# Patient Record
Sex: Male | Born: 1985 | Race: Black or African American | Hispanic: No | Marital: Single | Smoking: Former smoker
Health system: Southern US, Community
[De-identification: ages and names within clinical notes are randomized; demographics above are authoritative.]

## PROBLEM LIST (undated history)

## (undated) DIAGNOSIS — T7840XA Allergy, unspecified, initial encounter: Secondary | ICD-10-CM

## (undated) HISTORY — PX: FRACTURE SURGERY: SHX138

## (undated) HISTORY — DX: Allergy, unspecified, initial encounter: T78.40XA

---
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
## 2008-10-31 ENCOUNTER — Emergency Department (HOSPITAL_COMMUNITY): Admission: EM | Admit: 2008-10-31 | Discharge: 2008-10-31 | Payer: Self-pay | Admitting: Emergency Medicine

## 2010-06-14 LAB — URINALYSIS, ROUTINE W REFLEX MICROSCOPIC
Glucose, UA: NEGATIVE mg/dL
Hgb urine dipstick: NEGATIVE
Protein, ur: NEGATIVE mg/dL
Specific Gravity, Urine: 1.025 (ref 1.005–1.030)
pH: 6 (ref 5.0–8.0)

## 2010-07-03 ENCOUNTER — Other Ambulatory Visit: Payer: Self-pay

## 2010-07-03 ENCOUNTER — Other Ambulatory Visit: Payer: Self-pay | Admitting: Sports Medicine

## 2010-07-03 DIAGNOSIS — M25561 Pain in right knee: Secondary | ICD-10-CM

## 2010-07-04 ENCOUNTER — Ambulatory Visit
Admission: RE | Admit: 2010-07-04 | Discharge: 2010-07-04 | Disposition: A | Discharge status: Home / Self Care | Payer: No Typology Code available for payment source | Source: Ambulatory Visit | Attending: Sports Medicine | Admitting: Sports Medicine

## 2010-07-04 DIAGNOSIS — M25561 Pain in right knee: Secondary | ICD-10-CM

## 2010-08-08 ENCOUNTER — Ambulatory Visit (HOSPITAL_BASED_OUTPATIENT_CLINIC_OR_DEPARTMENT_OTHER)
Admission: RE | Admit: 2010-08-08 | Discharge: 2010-08-08 | Disposition: A | Discharge status: Home / Self Care | Payer: No Typology Code available for payment source | Source: Ambulatory Visit | Attending: Orthopedic Surgery | Admitting: Orthopedic Surgery

## 2010-08-08 DIAGNOSIS — S46819A Strain of other muscles, fascia and tendons at shoulder and upper arm level, unspecified arm, initial encounter: Secondary | ICD-10-CM | POA: Insufficient documentation

## 2010-08-08 DIAGNOSIS — X500XXA Overexertion from strenuous movement or load, initial encounter: Secondary | ICD-10-CM | POA: Insufficient documentation

## 2010-08-08 DIAGNOSIS — S43499A Other sprain of unspecified shoulder joint, initial encounter: Secondary | ICD-10-CM | POA: Insufficient documentation

## 2010-08-08 DIAGNOSIS — Z01812 Encounter for preprocedural laboratory examination: Secondary | ICD-10-CM | POA: Insufficient documentation

## 2010-08-08 LAB — POCT HEMOGLOBIN-HEMACUE: Hemoglobin: 15 g/dL (ref 13.0–17.0)

## 2010-08-12 NOTE — Op Note (Signed)
NAME:  Logan Ellis, Logan Ellis NO.:  [id number]  MEDICAL RECORD NO.:  [id number]  LOCATION:                                 FACILITY:  PHYSICIAN:  Eulas Post, MD    DATE OF BIRTH:  August 24, 1985  DATE OF PROCEDURE:  08/08/2010 DATE OF DISCHARGE:                              OPERATIVE REPORT   SURGEON:  Eulas Post, MD  FIRST ASSISTANT:  Janace Litten, OPA  SECOND ASSISTANT:  Darene Lamer, DO  PREOPERATIVE DIAGNOSIS:  Right posterior labrum tear.  POSTOPERATIVE DIAGNOSIS:  Right posterior labrum tear and fraying of undersurface supraspinatus.  OPERATIVE PROCEDURE:  Right shoulder arthroscopy with posterior labral repair and limited debridement of the undersurface of the rotator cuff.  PREOPERATIVE INDICATIONS:  Mr. Dawes Antolin is a 25 year old young man who had an injury to his right shoulder while he was swinging a pillow case and had acute onset posterior shoulder pain.  He failed extended conservative measures and elected for arthroscopic treatment.  The risks, benefits and alternatives were discussed before the procedure including but not limited to risks of infection, bleeding, nerve injury, recurrent labral tear, ongoing shoulder pain, stiffness, loss of function, cardiopulmonary complications, among others and he was willing to proceed.  OPERATIVE FINDINGS:  The shoulder had full motion under exam under anesthesia.  He was stable both anteriorly and posteriorly, although he did have a slight click with posterior loading.  Diagnostic arthroscopy demonstrated normal articular cartilage at the glenohumeral joint, and there was no superior labral tearing.  The biceps was normal.  The undersurface of the rotator cuff at the leading edge had a moderate amount of fraying, but probably about 15%.  The posterior labrum inferiorly as well as at the 9 o'clock position had fraying as well as some hypermobility.  His shoulder overall was fairly hypermobile.   I could almost do a drive-through sign despite the fact that his anterior- inferior glenohumeral ligament was completely intact.  OPERATIVE PROCEDURE:  The patient was brought to the operating room and placed in supine position.  IV antibiotics were given.  General anesthesia with a regional block had been administered.  Time-out was performed.  Examination under anesthesia was performed.  The above-named findings were noted.  The right upper extremity was prepped and draped in usual sterile fashion.  Diagnostic arthroscopy was carried out with the above-named findings.  I used the arthroscopic shaver and debrided the undersurface of the rotator cuff, removing about 5%, maybe 10% at the most at the leading edge, and this was all of the frayed tissue.  I also used the arthroscopic shaver to debride portions of the labrum at the inferiormost aspect.  I appreciated that the posterior labrum was torn, and therefore I switched portals and viewed from the anterior portal and then placed a second high lateral portal for anchor placement posteriorly.  I used the arthroscopic shaver to prepare the bony edge, as well as debride any loose tissue from the labrum, and then placed a horizontal mattress stitch using a 45-degree suture passer and then placed the posterior anchor into the appropriate position.  I had a 45- degree angle to the face.  Excellent fixation was achieved and the anchor was completely buried.  The labrum was well apposed back to the bone and stabilized.  I then removed the arthroscopic instruments after irrigating the shoulder and then closed the portals with Monocryl followed by Steri-Strips and sterile gauze.  He was placed in a sling. He was awakened and extubated and returned to the PACU in stable and satisfactory condition.  There were no complications and he tolerated the procedure well and will plan to be in a sling for 6 weeks postoperatively.     Eulas Post,  MD     JPL/MEDQ  D:  08/08/2010  T:  08/09/2010  Job:  [id number]  Electronically Signed by Teryl Lucy MD on 08/12/2010 07:29:05 AM
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
## 2011-03-23 ENCOUNTER — Ambulatory Visit (INDEPENDENT_AMBULATORY_CARE_PROVIDER_SITE_OTHER): Payer: No Typology Code available for payment source

## 2011-03-23 DIAGNOSIS — J45909 Unspecified asthma, uncomplicated: Secondary | ICD-10-CM

## 2011-06-11 ENCOUNTER — Ambulatory Visit (INDEPENDENT_AMBULATORY_CARE_PROVIDER_SITE_OTHER): Payer: No Typology Code available for payment source | Admitting: Physician Assistant

## 2011-06-11 VITALS — BP 133/77 | HR 76 | Temp 97.7°F | Resp 18 | Ht 70.5 in | Wt 226.0 lb

## 2011-06-11 DIAGNOSIS — J45909 Unspecified asthma, uncomplicated: Secondary | ICD-10-CM

## 2011-06-11 DIAGNOSIS — J309 Allergic rhinitis, unspecified: Secondary | ICD-10-CM

## 2011-06-11 DIAGNOSIS — R059 Cough, unspecified: Secondary | ICD-10-CM

## 2011-06-11 DIAGNOSIS — R062 Wheezing: Secondary | ICD-10-CM

## 2011-06-11 DIAGNOSIS — R05 Cough: Secondary | ICD-10-CM

## 2011-06-11 MED ORDER — AZITHROMYCIN 250 MG PO TABS: ORAL_TABLET | ORAL | Status: AC

## 2011-06-11 MED ORDER — FLUTICASONE-SALMETEROL 250-50 MCG/DOSE IN AEPB: 1.0000 | INHALATION_SPRAY | Freq: Two times a day (BID) | RESPIRATORY_TRACT | Status: DC

## 2011-06-11 MED ORDER — ALBUTEROL SULFATE HFA 108 (90 BASE) MCG/ACT IN AERS: 2.0000 | INHALATION_SPRAY | Freq: Four times a day (QID) | RESPIRATORY_TRACT | Status: DC | PRN

## 2011-06-11 MED ORDER — IPRATROPIUM BROMIDE 0.02 % IN SOLN
0.5000 mg | Freq: Once | RESPIRATORY_TRACT | Status: AC
  Administered 2011-06-11: 0.5 mg via RESPIRATORY_TRACT

## 2011-06-11 MED ORDER — HYDROCODONE-HOMATROPINE 5-1.5 MG/5ML PO SYRP: ORAL_SOLUTION | ORAL | Status: AC

## 2011-06-11 MED ORDER — ALBUTEROL SULFATE (2.5 MG/3ML) 0.083% IN NEBU
2.5000 mg | INHALATION_SOLUTION | Freq: Once | RESPIRATORY_TRACT | Status: AC
  Administered 2011-06-11: 2.5 mg via RESPIRATORY_TRACT

## 2011-06-11 NOTE — Progress Notes (Signed)
Patient ID: Logan Ellis MRN: [id number], DOB: 12-17-1985, 26 y.o. Date of Encounter: 06/11/2011, 11:17 AM  Primary Physician: No primary provider on file.  Chief Complaint:  Chief Complaint  Patient presents with  . Cough  . Nasal Congestion    since monday  . Asthma    need refill on inhaler    HPI: 26 y.o. year old male presents with a 3 day history of worsening nasal congestion, post nasal drip, sore throat, sinus pressure, cough, and wheezing. Afebrile. No chills. Nasal congestion thick and green/yellow. Cough is mostly nonproductive, but sometimes productive of green/yellow sputum and not associated with time of day. Ears feel full, leading to sensation of muffled hearing. He typically develops similar symptoms this time of year as the season starts to change. At baseline is asthma is well controlled with the use of Advair Diskus 250/50 bid, not requiring the usage of his albuterol inhaler. Although, since he has been sick he has been needing to use his albuterol inhaler multiple times per day. 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 Because of this he missed a day of work 2 days prior and requests a form completion so he can use his sick time. Has tried OTC cold preps and albuterol inhaler without success. No GI complaints. Appetite normal.  No sick contacts, recent antibiotics, or recent travels.   No leg trauma, sedentary periods, h/o cancer, or tobacco use.  Past Medical History  Diagnosis Date  . Allergic rhinitis   . Asthma      Home Meds: Prior to Admission medications   Medication Sig Start Date End Date Taking? Authorizing Provider  albuterol (PROVENTIL HFA;VENTOLIN HFA) 108 (90 BASE) MCG/ACT inhaler Inhale 2 puffs into the lungs every 6 (six) hours as needed.   Yes Historical Provider, MD  fluticasone-salmeterol (ADVAIR HFA) 230-21 MCG/ACT inhaler Inhale 2  puffs into the lungs 2 (two) times daily.   Yes Historical Provider, MD    Allergies: No Known Allergies  History   Social History  . Marital Status: Single    Spouse Name: N/A    Number of Children: N/A  . Years of Education: N/A   Occupational History  . Not on file.   Social History Main Topics  . Smoking status: Never Smoker   . Smokeless tobacco: Not on file  . Alcohol Use: Not on file  . Drug Use: Not on file  . Sexually Active: Not on file   Other Topics Concern  . Not on file   Social History Narrative  . No narrative on file     Review of Systems: Constitutional: negative for chills, fever, night sweats or weight changes Cardiovascular: negative for chest pain or palpitations Respiratory: negative for hemoptysis,dyspnea, or chest pain Abdominal: negative for abdominal pain, nausea, vomiting or diarrhea Dermatological: negative for rash Neurologic: negative for headache   Physical Exam: Blood pressure 133/77, pulse 76, temperature 97.7 F (36.5 C), temperature source Oral, resp. rate 18, height 5' 10.5" (1.791 m), weight 226 lb (102.513 kg)., Body mass index is 31.97 kg/(m^2). General: Well developed, well nourished, in no acute distress. Head: Normocephalic, atraumatic, eyes without discharge, sclera non-icteric, nares are congested. Bilateral auditory canals clear, TM's are without perforation, pearly grey with reflective cone of light bilaterally. No sinus TTP. Oral cavity moist, dentition normal. Posterior pharynx with post nasal drip and mild erythema.  No peritonsillar abscess or tonsillar exudate. Neck: Supple. No thyromegaly. Full ROM. No lymphadenopathy. Lungs: Expiratory wheezing bilaterally otherwise clear breath sounds bilaterally without rales or rhonchi. Breathing is unlabored.  Heart: RRR with S1 S2. No murmurs, rubs, or gallops appreciated. Msk:  Strength and tone normal for age. Extremities: No clubbing or cyanosis. No edema. Neuro: Alert and  oriented X 3. Moves all extremities spontaneously. CNII-XII grossly in tact. Psych:  Responds to questions appropriately with a normal affect.   S/P Albuterol and Atrovent neb: Patient feels considerably better. No further wheezing to auscultation   ASSESSMENT AND PLAN:  26 y.o. year old male with asthmatic bronchitis/cough -Azithromycin 250 MG #6 2 po first day then 1 po next 4 days no RF -Hycodan #4oz 1 tsp po q 4-6 hours prn cough no RF SED -Proventil 2 puffs inhaled q 4-6 hours prn #1 RF 2 -Advair Diskus 250/50 mcg #1 1 puff inhaled bid RF 6 -OOW through 06/12/11, form completed -Albuterol/Atrovent neb in office -Mucinex -Tylenol/Motrin prn -Rest/fluids -RTC precautions -RTC 3-5 days if no improvement  Signed, Eula Listen, PA-C 06/11/2011 11:17 AM

## 2011-06-13 ENCOUNTER — Encounter: Payer: Self-pay | Admitting: Family Medicine

## 2011-07-01 ENCOUNTER — Ambulatory Visit (INDEPENDENT_AMBULATORY_CARE_PROVIDER_SITE_OTHER): Payer: No Typology Code available for payment source | Admitting: Physician Assistant

## 2011-07-01 DIAGNOSIS — R062 Wheezing: Secondary | ICD-10-CM

## 2011-07-01 DIAGNOSIS — J45909 Unspecified asthma, uncomplicated: Secondary | ICD-10-CM

## 2011-07-01 MED ORDER — IPRATROPIUM BROMIDE 0.02 % IN SOLN: 500.0000 ug | Freq: Four times a day (QID) | RESPIRATORY_TRACT | Status: DC

## 2011-07-01 MED ORDER — ALBUTEROL SULFATE (2.5 MG/3ML) 0.083% IN NEBU
2.5000 mg | INHALATION_SOLUTION | Freq: Once | RESPIRATORY_TRACT | Status: AC
  Administered 2011-07-01: 2.5 mg via RESPIRATORY_TRACT

## 2011-07-01 MED ORDER — ALBUTEROL SULFATE (2.5 MG/3ML) 0.083% IN NEBU: 2.5000 mg | INHALATION_SOLUTION | Freq: Four times a day (QID) | RESPIRATORY_TRACT | Status: DC | PRN

## 2011-07-01 MED ORDER — IPRATROPIUM BROMIDE 0.02 % IN SOLN
0.5000 mg | Freq: Once | RESPIRATORY_TRACT | Status: AC
  Administered 2011-07-01: 0.5 mg via RESPIRATORY_TRACT

## 2011-07-01 NOTE — Progress Notes (Signed)
Patient ID: Logan Ellis MRN: [id number], DOB: Oct 18, 1985, 26 y.o. Date of Encounter: 07/01/2011, 8:53 AM  Primary Physician: Tally Due, MD, MD  Chief Complaint:  Chief Complaint  Patient presents with  . Asthma    asthma attack last night at work  . Shortness of Breath    x 1 day  . Cough    HPI: 26 y.o. year old male presents with 3 day history of slowly worsening wheezing and SOB. See OV from 06/11/11. Unfortunately he did have an asthma exacerbation the previous night at work while working in a dusty environment. While at work he was requiring the use of his albuterol inhaler multiple times. This did help him, but did not fully resolve his symptoms of wheezing and SOB. This morning he presents with continued mild wheezing and SOB, although improved from the previous night. He did not pick up his Advair Diskus until this morning, which he did use the morning. Prior to this most recent asthma exacerbation his asthma was well controlled, not requiring the use of his rescue inhaler. He has remained afebrile and without chills. No cough.  No sick contacts, or recent travels.   No leg trauma, sedentary periods, h/o cancer, or tobacco use.  Past Medical History  Diagnosis Date  . Allergic rhinitis   . Asthma      Home Meds: Prior to Admission medications   Medication Sig Start Date End Date Taking? Authorizing Provider  albuterol (PROVENTIL HFA;VENTOLIN HFA) 108 (90 BASE) MCG/ACT inhaler Inhale 2 puffs into the lungs every 6 (six) hours as needed. 06/11/11  Yes Melanny Wire M Ledora Delker, PA-C  Fluticasone-Salmeterol (ADVAIR) 250-50 MCG/DOSE AEPB Inhale 1 puff into the lungs 2 (two) times daily. 06/11/11  Yes Oluwatoni Rotunno M Shrihan Putt, PA-C    Allergies: No Known Allergies  History   Social History  . Marital Status: Single    Spouse Name: N/A    Number of Children: N/A  . Years of Education: N/A   Occupational History  . Not on file.   Social History Main Topics  . Smoking status: Former  Smoker -- 0.3 packs/day    Quit date: 06/30/2009  . Smokeless tobacco: Not on file  . Alcohol Use: Not on file  . Drug Use: Not on file  . Sexually Active: Not on file   Other Topics Concern  . Not on file   Social History Narrative  . No narrative on file     Review of Systems: Constitutional: negative for chills, fever, night sweats or weight changes Cardiovascular: negative for chest pain or palpitations Respiratory: negative for hemoptysis, wheezing, or shortness of breath Abdominal: negative for abdominal pain, nausea, vomiting or diarrhea Dermatological: negative for rash Neurologic: negative for headache   Physical Exam: Blood pressure 132/84, pulse 69, temperature 97.8 F (36.6 C), temperature source Oral, resp. rate 18, height 5' 10.5" (1.791 m), weight 220 lb (99.791 kg), SpO2 97.00%., Body mass index is 31.12 kg/(m^2). General: Well developed, well nourished, in no acute distress. Head: Normocephalic, atraumatic, eyes without discharge, sclera non-icteric, nares are congested. Bilateral auditory canals clear, TM's are without perforation, pearly grey with reflective cone of light bilaterally. Serous effusion bilaterally behind TM's. Maxillary sinus TTP. Oral cavity moist, dentition normal. Posterior pharynx with post nasal drip and mild erythema. No peritonsillar abscess or tonsillar exudate. Neck: Supple. No thyromegaly. Full ROM. No lymphadenopathy. Lungs: Mild expiratory wheezing bilaterally without rales or rhonchi. Breathing is unlabored.  Heart: RRR with S1 S2. No  murmurs, rubs, or gallops appreciated. Msk:  Strength and tone normal for age. Extremities: No clubbing or cyanosis. No edema. Neuro: Alert and oriented X 3. Moves all extremities spontaneously. CNII-XII grossly in tact. Psych:  Responds to questions appropriately with a normal affect.   S/P Albuterol and Atrovent neb: Patient feels better. Decreased wheezing to auscultation. S/P Albuterol neb: Patient  feels like his breathing is back to baseline. No further wheezing to auscultation.   ASSESSMENT AND PLAN:  26 y.o. year old male with acute asthma exacerbation -Albuterol and Atrovent neb in office -Nebulizer machine for home -Albuterol nebs 2.5 mg/50mL #1 box 1 neb every 6 hours prn RF 12 -Atrovent nebs 0.02% sol #1 box 1 neb qid prn RF 12 -Continue daily Adviar Diskus as directed -Mucinex -Tylenol/Motrin prn -Rest/fluids -RTC precautions -RTC 3-5 days if no improvement  Signed, Eula Listen, PA-C 07/01/2011 8:53 AM

## 2012-01-18 ENCOUNTER — Other Ambulatory Visit: Payer: Self-pay | Admitting: Physician Assistant

## 2012-03-03 ENCOUNTER — Telehealth: Payer: Self-pay

## 2012-03-03 NOTE — Telephone Encounter (Signed)
PT SAYS HE RECEIVED LETTER FROM PHARMACY SAYING HIS INSURANCE WILL NOW REQUIRE PRIOR APPROVAL ON SOME MEDICATIONS.  THE VENTOLIN INHALER HE USES IS ON THIS LIST.  AFTER SPEAKING WITH THE PHARMACY, THEY TOLD HIM TO GIVE [place] A NUMBER FOR THE INSURANCE FOR THE APPROVAL.  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  THEIR NUMBER IS [phone].  YOU CAN REACH THE PT AT [phone]

## 2012-03-04 NOTE — Telephone Encounter (Signed)
Called pt to ask him if he has tried any other albuterol inhalers in the past. Pt stated that he used to use the Pro-Air and it wasn't working well for him. When he came in w/a URI, the provider had changed him to the Ventolin and it is working much better for him.  Called [facility] and was told that the forms for the prior auths for 2014 are not available yet. The rep took info and fax # and stated that after 03/09/12 when forms are available, one will be faxed to [place] for the pt.

## 2012-03-10 MED ORDER — ALBUTEROL SULFATE HFA 108 (90 BASE) MCG/ACT IN AERS: 2.0000 | INHALATION_SPRAY | RESPIRATORY_TRACT | Status: DC | PRN

## 2012-03-10 NOTE — Telephone Encounter (Signed)
Received a fax/form to complete for prior auth of Ventolin. The ins is requiring that the pt have tried BOTH of their preferred alternatives and pt has only tried/failed Pro-Air. He has not tried Proventil HFA. I called the pt and he is willing to try the Proventil so that ins will cover it. I have advised pt to let us know if it does not work as well for him and we can try to do a prior auth again w/that info. Ryan, do you want [place] to send in a Rx of [facility]ventil to [facility]?

## 2012-03-10 NOTE — Telephone Encounter (Signed)
Please call patient. Proventil has been sent in for him. Just have him let us know what is or is not working for him.

## 2012-03-10 NOTE — Addendum Note (Signed)
Addended by: Sondra Barges on: 03/10/2012 03:13 PM   Modules accepted: Orders

## 2012-03-11 NOTE — Telephone Encounter (Signed)
LMOM for pt that the Proventil has been sent to pharmacy and advised him to CB if it does not work well for him.

## 2012-06-01 ENCOUNTER — Ambulatory Visit (INDEPENDENT_AMBULATORY_CARE_PROVIDER_SITE_OTHER): Payer: No Typology Code available for payment source | Admitting: Physician Assistant

## 2012-06-01 VITALS — BP 134/80 | HR 100 | Temp 98.1°F | Resp 17 | Wt 235.0 lb

## 2012-06-01 DIAGNOSIS — R05 Cough: Secondary | ICD-10-CM

## 2012-06-01 DIAGNOSIS — J029 Acute pharyngitis, unspecified: Secondary | ICD-10-CM

## 2012-06-01 DIAGNOSIS — R509 Fever, unspecified: Secondary | ICD-10-CM

## 2012-06-01 DIAGNOSIS — R059 Cough, unspecified: Secondary | ICD-10-CM

## 2012-06-01 LAB — POCT CBC
Granulocyte percent: 59.9 %G (ref 37–80)
HCT, POC: 43.5 % (ref 43.5–53.7)
Hemoglobin: 13.9 g/dL — AB (ref 14.1–18.1)
Lymph, poc: 1.5 (ref 0.6–3.4)
MCH, POC: 28 pg (ref 27–31.2)
MCHC: 32 g/dL (ref 31.8–35.4)
MCV: 87.8 fL (ref 80–97)
MID (cbc): 0.7 (ref 0–0.9)
MPV: 7.4 fL (ref 0–99.8)
POC Granulocyte: 3.2 (ref 2–6.9)
POC LYMPH PERCENT: 27.2 %L (ref 10–50)
POC MID %: 12.9 %M — AB (ref 0–12)
Platelet Count, POC: 239 10*3/uL (ref 142–424)
RBC: 4.96 M/uL (ref 4.69–6.13)
RDW, POC: 13.7 %
WBC: 5.4 10*3/uL (ref 4.6–10.2)

## 2012-06-01 LAB — POCT INFLUENZA A/B
Influenza A, POC: NEGATIVE
Influenza B, POC: NEGATIVE

## 2012-06-01 LAB — POCT RAPID STREP A (OFFICE): Rapid Strep A Screen: NEGATIVE

## 2012-06-01 MED ORDER — HYDROCOD POLST-CHLORPHEN POLST 10-8 MG/5ML PO LQCR: 5.0000 mL | Freq: Two times a day (BID) | ORAL | Status: DC | PRN

## 2012-06-01 MED ORDER — CEFDINIR 300 MG PO CAPS: 300.0000 mg | ORAL_CAPSULE | Freq: Two times a day (BID) | ORAL | Status: DC

## 2012-06-01 NOTE — Progress Notes (Signed)
Subjective:    Patient ID: Logan Ellis, male    DOB: October 07, 1985, 27 y.o.   MRN: [id number]  HPI 27 year old male presents with 3 day history of fever, chills, sore throat, nasal congestion, and cough.  Admits to pain in his chest while coughing, although this has improved.  Denies otalgia, sinus pain, nausea, vomiting, or dizziness.  No known flu contacts but does admit he had a coworker that had a similar illness 4 days ago.  He has been taking tylenol and aleve which has helped some with the pain.  History of seasonal allergies but no current treatment for this.    Patient otherwise healthy with no other concerns today.     Review of Systems  Constitutional: Positive for fever and chills.  HENT: Positive for congestion, sore throat, rhinorrhea and postnasal drip. Negative for ear pain.   Respiratory: Positive for cough. Negative for shortness of breath and wheezing.   Gastrointestinal: Negative for nausea, vomiting and abdominal pain.  Skin: Negative for rash.  Neurological: Positive for headaches. Negative for dizziness.       Objective:   Physical Exam  Constitutional: He is oriented to person, place, and time. He appears well-developed and well-nourished.  HENT:  Head: Normocephalic and atraumatic.  Right Ear: Hearing, tympanic membrane, external ear and ear canal normal.  Left Ear: Hearing, tympanic membrane, external ear and ear canal normal.  Mouth/Throat: Uvula is midline and mucous membranes are normal. Posterior oropharyngeal erythema (1+ tonsillar swelling) present. No oropharyngeal exudate.  Eyes: Conjunctivae are normal.  Neck: Normal range of motion. Neck supple.  Cardiovascular: Normal rate, regular rhythm and normal heart sounds.   Pulmonary/Chest: Effort normal and breath sounds normal.  Lymphadenopathy:    He has no cervical adenopathy.  Neurological: He is alert and oriented to person, place, and time.  Psychiatric: He has a normal mood and affect. His  behavior is normal. Judgment and thought content normal.     Results for orders placed in visit on 06/01/12  POCT CBC      Result Value Range   WBC 5.4  4.6 - 10.2 K/uL   Lymph, poc 1.5  0.6 - 3.4   POC LYMPH PERCENT 27.2  10 - 50 %L   MID (cbc) 0.7  0 - 0.9   POC MID % 12.9 (*) 0 - 12 %M   POC Granulocyte 3.2  2 - 6.9   Granulocyte percent 59.9  37 - 80 %G   RBC 4.96  4.69 - 6.13 M/uL   Hemoglobin 13.9 (*) 14.1 - 18.1 g/dL   HCT, POC 40.9  81.1 - 53.7 %   MCV 87.8  80 - 97 fL   MCH, POC 28.0  27 - 31.2 pg   MCHC 32.0  31.8 - 35.4 g/dL   RDW, POC 91.4     Platelet Count, POC 239  142 - 424 K/uL   MPV 7.4  0 - 99.8 fL  POCT RAPID STREP A (OFFICE)      Result Value Range   Rapid Strep A Screen Negative  Negative  POCT INFLUENZA A/B      Result Value Range   Influenza A, POC Negative     Influenza B, POC Negative          Assessment & Plan:  Acute pharyngitis - Plan: POCT CBC, POCT rapid strep A, cefdinir (OMNICEF) 300 MG capsule  Cough - Plan: cefdinir (OMNICEF) 300 MG capsule, chlorpheniramine-HYDROcodone (TUSSIONEX PENNKINETIC ER) 10-8  MG/5ML LQCR  Fever, unspecified - Plan: POCT CBC, POCT Influenza A/B  Will go ahead and cover with Omnicef 300 mg bid x 10 days Increase fluids and rest Tussionex qhs prn cough Mucinex as directed Follow up if symptoms worsen or fail to improve  OK to be out of work x 3 days. Return to work on 06/04/12

## 2012-06-02 DIAGNOSIS — Z0271 Encounter for disability determination: Secondary | ICD-10-CM

## 2012-06-06 ENCOUNTER — Telehealth: Payer: Self-pay

## 2012-06-06 NOTE — Telephone Encounter (Signed)
Patient says that he needs to be cleared to go back to work and we have a form here? Please call patient at [phone]

## 2012-06-06 NOTE — Telephone Encounter (Signed)
He needs FMLA forms do you have these?

## 2012-06-23 ENCOUNTER — Other Ambulatory Visit: Payer: Self-pay

## 2012-06-23 DIAGNOSIS — J45901 Unspecified asthma with (acute) exacerbation: Secondary | ICD-10-CM

## 2012-06-23 DIAGNOSIS — J45909 Unspecified asthma, uncomplicated: Secondary | ICD-10-CM

## 2012-06-23 DIAGNOSIS — R062 Wheezing: Secondary | ICD-10-CM

## 2012-06-23 MED ORDER — FLUTICASONE-SALMETEROL 250-50 MCG/DOSE IN AEPB: 1.0000 | INHALATION_SPRAY | Freq: Two times a day (BID) | RESPIRATORY_TRACT | Status: DC

## 2012-12-08 ENCOUNTER — Other Ambulatory Visit: Payer: Self-pay | Admitting: Orthopedic Surgery

## 2012-12-08 DIAGNOSIS — M25562 Pain in left knee: Secondary | ICD-10-CM

## 2012-12-08 DIAGNOSIS — M1220 Villonodular synovitis (pigmented), unspecified site: Secondary | ICD-10-CM

## 2012-12-12 ENCOUNTER — Ambulatory Visit
Admission: RE | Admit: 2012-12-12 | Discharge: 2012-12-12 | Disposition: A | Discharge status: Home / Self Care | Payer: No Typology Code available for payment source | Source: Ambulatory Visit | Attending: Orthopedic Surgery | Admitting: Orthopedic Surgery

## 2012-12-12 DIAGNOSIS — M1220 Villonodular synovitis (pigmented), unspecified site: Secondary | ICD-10-CM

## 2012-12-12 DIAGNOSIS — M25562 Pain in left knee: Secondary | ICD-10-CM

## 2012-12-18 ENCOUNTER — Other Ambulatory Visit: Payer: Self-pay | Admitting: Physician Assistant

## 2013-01-30 ENCOUNTER — Other Ambulatory Visit: Payer: Self-pay | Admitting: Physician Assistant

## 2013-03-05 ENCOUNTER — Other Ambulatory Visit: Payer: Self-pay | Admitting: Physician Assistant

## 2013-05-17 ENCOUNTER — Other Ambulatory Visit: Payer: Self-pay | Admitting: Physician Assistant

## 2013-05-25 ENCOUNTER — Other Ambulatory Visit: Payer: Self-pay | Admitting: Physician Assistant

## 2013-05-25 ENCOUNTER — Telehealth: Payer: Self-pay

## 2013-05-25 NOTE — Telephone Encounter (Signed)
Patient states we denied his inhalers (due to need for OV)  He doesn't have time to come in this week, but can come in next week.   [phone]

## 2013-05-26 ENCOUNTER — Other Ambulatory Visit: Payer: Self-pay | Admitting: Physician Assistant

## 2013-05-26 MED ORDER — ALBUTEROL SULFATE HFA 108 (90 BASE) MCG/ACT IN AERS: 2.0000 | INHALATION_SPRAY | RESPIRATORY_TRACT | Status: DC | PRN

## 2013-05-26 NOTE — Telephone Encounter (Signed)
I have sent albuterol inhaler to pharmacy.  Pt needs follow up.  He has not been seen for asthma in nearly 2 years.  Absolutely no further refills until OV.

## 2013-05-28 NOTE — Telephone Encounter (Signed)
LM advising pt rx at pharmacy and pt will need to RTC for further refills.

## 2013-06-27 ENCOUNTER — Ambulatory Visit (INDEPENDENT_AMBULATORY_CARE_PROVIDER_SITE_OTHER): Payer: No Typology Code available for payment source | Admitting: Family Medicine

## 2013-06-27 ENCOUNTER — Encounter: Payer: Self-pay | Admitting: Family Medicine

## 2013-06-27 VITALS — BP 110/62 | HR 80 | Temp 98.4°F | Resp 16 | Ht 70.5 in | Wt 227.8 lb

## 2013-06-27 DIAGNOSIS — J309 Allergic rhinitis, unspecified: Secondary | ICD-10-CM

## 2013-06-27 DIAGNOSIS — J45909 Unspecified asthma, uncomplicated: Secondary | ICD-10-CM

## 2013-06-27 DIAGNOSIS — J302 Other seasonal allergic rhinitis: Secondary | ICD-10-CM

## 2013-06-27 MED ORDER — IPRATROPIUM BROMIDE 0.02 % IN SOLN: RESPIRATORY_TRACT | Status: DC

## 2013-06-27 MED ORDER — ALBUTEROL SULFATE HFA 108 (90 BASE) MCG/ACT IN AERS: 2.0000 | INHALATION_SPRAY | Freq: Four times a day (QID) | RESPIRATORY_TRACT | Status: DC | PRN

## 2013-06-27 MED ORDER — ALBUTEROL SULFATE (2.5 MG/3ML) 0.083% IN NEBU: 3.0000 mL | INHALATION_SOLUTION | Freq: Four times a day (QID) | RESPIRATORY_TRACT | Status: DC | PRN

## 2013-06-27 MED ORDER — DESLORATADINE 5 MG PO TABS: 5.0000 mg | ORAL_TABLET | Freq: Every day | ORAL | Status: DC

## 2013-06-27 MED ORDER — FLUTICASONE-SALMETEROL 250-50 MCG/DOSE IN AEPB: 1.0000 | INHALATION_SPRAY | Freq: Two times a day (BID) | RESPIRATORY_TRACT | Status: DC

## 2013-06-27 NOTE — Patient Instructions (Addendum)
Asthma Attack Prevention Although there is no way to prevent asthma from starting, you can take steps to control the disease and reduce its symptoms. Learn about your asthma and how to control it. Take an active role to control your asthma by working with your health care provider to create and follow an asthma action plan. An asthma action plan guides you in:  Taking your medicines properly.  Avoiding things that set off your asthma or make your asthma worse (asthma triggers).  Tracking your level of asthma control.  Responding to worsening asthma.  Seeking emergency care when needed. To track your asthma, keep records of your symptoms, check your peak flow number using a handheld device that shows how well air moves out of your lungs (peak flow meter), and get regular asthma checkups.  WHAT ARE SOME WAYS TO PREVENT AN ASTHMA ATTACK?  Take medicines as directed by your health care provider.  Keep track of your asthma symptoms and level of control.  With your health care provider, write a detailed plan for taking medicines and managing an asthma attack. Then be sure to follow your action plan. Asthma is an ongoing condition that needs regular monitoring and treatment.  Identify and avoid asthma triggers. Many outdoor allergens and irritants (such as pollen, mold, cold air, and air pollution) can trigger asthma attacks. Find out what your asthma triggers are and take steps to avoid them.  Monitor your breathing. Learn to recognize warning signs of an attack, such as coughing, wheezing, or shortness of breath. Your lung function may decrease before you notice any signs or symptoms, so regularly measure and record your peak airflow with a home peak flow meter.  Identify and treat attacks early. If you act quickly, you are less likely to have a severe attack. You will also need less medicine to control your symptoms. When your peak flow measurements decrease and alert you to an upcoming attack,  take your medicine as instructed and immediately stop any activity that may have triggered the attack. If your symptoms do not improve, get medical help.  Pay attention to increasing quick-relief inhaler use. If you find yourself relying on your quick-relief inhaler, your asthma is not under control. See your health care provider about adjusting your treatment. WHAT CAN MAKE MY SYMPTOMS WORSE? A number of common things can set off or make your asthma symptoms worse and cause temporary increased inflammation of your airways. Keep track of your asthma symptoms for several weeks, detailing all the environmental and emotional factors that are linked with your asthma. When you have an asthma attack, go back to your asthma diary to see which factor, or combination of factors, might have contributed to it. Once you know what these factors are, you can take steps to control many of them. If you have allergies and asthma, it is important to take asthma prevention steps at home. Minimizing contact with the substance to which you are allergic will help prevent an asthma attack. Some triggers and ways to avoid these triggers are: Animal Dander:  Some people are allergic to the flakes of skin or dried saliva from animals with fur or feathers.   There is no such thing as a hypoallergenic dog or cat breed. All dogs or cats can cause allergies, even if they don't shed.  Keep these pets out of your home.  If you are not able to keep a pet outdoors, keep the pet out of your bedroom and other sleeping areas at all   times, and keep the door closed.  Remove carpets and furniture covered with cloth from your home. If that is not possible, keep the pet away from fabric-covered furniture and carpets. Dust Mites: Many people with asthma are allergic to dust mites. Dust mites are tiny bugs that are found in every home in mattresses, pillows, carpets, fabric-covered furniture, bedcovers, clothes, stuffed toys, and other  fabric-covered items.   Cover your mattress in a special dust-proof cover.  Cover your pillow in a special dust-proof cover, or wash the pillow each week in hot water. Water must be hotter than 130 F (54.4 C) to kill dust mites. Cold or warm water used with detergent and bleach can also be effective.  Wash the sheets and blankets on your bed each week in hot water.  Try not to sleep or lie on cloth-covered cushions.  Call ahead when traveling and ask for a smoke-free hotel room. Bring your own bedding and pillows in case the hotel only supplies feather pillows and down comforters, which may contain dust mites and cause asthma symptoms.  Remove carpets from your bedroom and those laid on concrete, if you can.  Keep stuffed toys out of the bed, or wash the toys weekly in hot water or cooler water with detergent and bleach. Cockroaches: Many people with asthma are allergic to the droppings and remains of cockroaches.   Keep food and garbage in closed containers. Never leave food out.  Use poison baits, traps, powders, gels, or paste (for example, boric acid).  If a spray is used to kill cockroaches, stay out of the room until the odor goes away. Indoor Mold:  Fix leaky faucets, pipes, or other sources of water that have mold around them.  Clean floors and moldy surfaces with a fungicide or diluted bleach.  Avoid using humidifiers, vaporizers, or swamp coolers. These can spread molds through the air. Pollen and Outdoor Mold:  When pollen or mold spore counts are high, try to keep your windows closed.  Stay indoors with windows closed from late morning to afternoon. Pollen and some mold spore counts are highest at that time.  Ask your health care provider whether you need to take anti-inflammatory medicine or increase your dose of the medicine before your allergy season starts. Other Irritants to Avoid:  Tobacco smoke is an irritant. If you smoke, ask your health care provider how  you can quit. Ask family members to quit smoking too. Do not allow smoking in your home or car.  If possible, do not use a wood-burning stove, kerosene heater, or fireplace. Minimize exposure to all sources of smoke, including to incense, candles, fires, and fireworks.  Try to stay away from strong odors and sprays, such as perfume, talcum powder, hair spray, and paints.  Decrease humidity in your home and use an indoor air cleaning device. Reduce indoor humidity to below 60%. Dehumidifiers or central air conditioners can do this.  Decrease house dust exposure by changing furnace and air cooler filters frequently.  Try to have someone else vacuum for you once or twice a week. Stay out of rooms while they are being vacuumed and for a short while afterward.  If you vacuum, use a dust mask from a hardware store, a double-layered or microfilter vacuum cleaner bag, or a vacuum cleaner with a HEPA filter.  Sulfites in foods and beverages can be irritants. Do not drink beer or wine or eat dried fruit, processed potatoes, or shrimp if they cause asthma symptoms.    Cold air can trigger an asthma attack. Cover your nose and mouth with a scarf on cold or windy days.  Several health conditions can make asthma more difficult to manage, including a runny nose, sinus infections, reflux disease, psychological stress, and sleep apnea. Work with your health care provider to manage these conditions.  Avoid close contact with people who have a respiratory infection such as a cold or the flu, since your asthma symptoms may get worse if you catch the infection. Wash your hands thoroughly after touching items that may have been handled by people with a respiratory infection.  Get a flu shot every year to protect against the flu virus, which often makes asthma worse for days or weeks. Also get a pneumonia shot if you have not previously had one. Unlike the flu shot, the pneumonia shot does not need to be given  yearly. Medicines:  Talk to your health care provider about whether it is safe for you to take aspirin or non-steroidal anti-inflammatory medicines (NSAIDs). In a small number of people with asthma, aspirin and NSAIDs can cause asthma attacks. These medicines must be avoided by people who have known aspirin-sensitive asthma. It is important that people with aspirin-sensitive asthma read labels of all over-the-counter medicines used to treat pain, colds, coughs, and fever.  Beta blockers and ACE inhibitors are other medicines you should discuss with your health care provider. HOW CAN I FIND OUT WHAT I AM ALLERGIC TO? Ask your asthma health care provider about allergy skin testing or blood testing (the RAST test) to identify the allergens to which you are sensitive. If you are found to have allergies, the most important thing to do is to try to avoid exposure to any allergens that you are sensitive to as much as possible. Other treatments for allergies, such as medicines and allergy shots (immunotherapy) are available.  CAN I EXERCISE? Follow your health care provider's advice regarding asthma treatment before exercising. It is important to maintain a regular exercise program, but vigorous exercise, or exercise in cold, humid, or dry environments can cause asthma attacks, especially for those people who have exercise-induced asthma. Document Released: 02/11/2009 Document Revised: 10/26/2012 Document Reviewed: 08/31/2012 [facility] Patient Information 2014 [place].   I have prescribed an antihistamine to help reduce allergy symptoms. Take this medication (Clarinex- generic) daily. Also get an over-the-counter saline nasal spray (AYR) and use it as per package directions to help reduce congestion.    Pneumococcal Vaccine, Polyvalent solution for injection What is this medicine? PNEUMOCOCCAL VACCINE, POLYVALENT (NEU mo KOK al vak SEEN, pol ee VEY luhnt) is a vaccine to prevent pneumococcus  bacteria infection. These bacteria are a major cause of ear infections, Strep throat infections, and serious pneumonia, meningitis, or blood infections worldwide. These vaccines help the body to produce antibodies (protective substances) that help your body defend against these bacteria. This vaccine is recommended for people 402 years of age and older with health problems. It is also recommended for all adults over 26 years old. This vaccine will not treat an infection. This medicine may be used for other purposes; ask your health care provider or pharmacist if you have questions. COMMON BRAND NAME(S): Pneumovax 23 What should I tell my health care provider before I take this medicine? They need to know if you have any of these conditions: -bleeding problems -bone marrow or organ transplant -cancer, Hodgkin's disease -fever -infection -immune system problems -low platelet count in the blood -seizures -an unusual or allergic reaction to  pneumococcal vaccine, diphtheria toxoid, other vaccines, latex, other medicines, foods, dyes, or preservatives -pregnant or trying to get pregnant -breast-feeding How should I use this medicine? This vaccine is for injection into a muscle or under the skin. It is given by a health care professional. A copy of Vaccine Information Statements will be given before each vaccination. Read this sheet carefully each time. The sheet may change frequently. Talk to your pediatrician regarding the use of this medicine in children. While this drug may be prescribed for children as young as 482 years of age for selected conditions, precautions do apply. Overdosage: If you think you have taken too much of this medicine contact a poison control center or emergency room at once. NOTE: This medicine is only for you. Do not share this medicine with others. What if I miss a dose? It is important not to miss your dose. Call your doctor or health care professional if you are unable to  keep an appointment. What may interact with this medicine? -medicines for cancer chemotherapy -medicines that suppress your immune function -medicines that treat or prevent blood clots like warfarin, enoxaparin, and dalteparin -steroid medicines like prednisone or cortisone This list may not describe all possible interactions. Give your health care provider a list of all the medicines, herbs, non-prescription drugs, or dietary supplements you use. Also tell them if you smoke, drink alcohol, or use illegal drugs. Some items may interact with your medicine. What should I watch for while using this medicine? Mild fever and pain should go away in 3 days or less. Report any unusual symptoms to your doctor or health care professional. What side effects may I notice from receiving this medicine? Side effects that you should report to your doctor or health care professional as soon as possible: -allergic reactions like skin rash, itching or hives, swelling of the face, lips, or tongue -breathing problems -confused -fever over 102 degrees F -pain, tingling, numbness in the hands or feet -seizures -unusual bleeding or bruising -unusual muscle weakness Side effects that usually do not require medical attention (report to your doctor or health care professional if they continue or are bothersome): -aches and pains -diarrhea -fever of 102 degrees F or less -headache -irritable -loss of appetite -pain, tender at site where injected -trouble sleeping This list may not describe all possible side effects. Call your doctor for medical advice about side effects. You may report side effects to FDA at 1-800-FDA-1088. Where should I keep my medicine? This does not apply. This vaccine is given in a clinic, pharmacy, doctor's office, or other health care setting and will not be stored at home. NOTE: This sheet is a summary. It may not cover all possible information. If you have questions about this medicine,  talk to your doctor, pharmacist, or health care provider.  2014, [employer]vier/Gold Standard. (2007-09-30 14:32:37)  You should consider getting the Pneumonia vaccine since you have a chronic pulmonary disease. Ask Dr. Lin GivensSavage about getting this vaccine which can be administered at any healthcare provider's office.

## 2013-06-27 NOTE — Progress Notes (Signed)
S:  This 28 y.o. 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 He is compliant chronic MDIs (Albuterol and Advair) and has home nebulizer for prn use. Seasonal allergies worsen his symptoms; he has NP nocturnal cough, nasal congestion, sinus HA and SOB. Pt reorts a sore throat a few weeks ago. His work environment is dusty; he works as Armed forces operational officerbackroom supervisor at [employer] and wears a mask occasionally. He has good results w/ OTC Allegra-D.   Pt has a benign bone tumor; [facility]. Current treatment is Gleevec 1 tablet daily.  Immunization status- no Flu vaccine this past winter. Has not had Pneumococcal vaccine.  PMHx, Surg Hx, Soc and Fam Hx reviewed.    MEDICATIONS reconciled.  ROS: As per HPI; negative for fever/chills, fatigue, anorexia, epistaxis, sore throat, PND, ear pain, tinnitus, enlarged lymph nodes, CP or tightness, wheezing, apnea, GI problems, dizziness, weakness or syncope.  O: Filed Vitals:   06/27/13 1319  BP: 110/62  Pulse: 80  Temp: 98.4 F (36.9 C)  Resp: 16   GEN: In NAD; WN,WD. HENT: [place]/AT; EOMI w/ clear conj/sclerae. EACs/canals/TMs normal. Nasal mucosa edematous and red; post ph erythematous w/ cobblestoning- no nexudate. NT sinuses. NECK: Supple w/o LAN. Thyroid gland diffusely enlarged. COR: RRR. Normal S1 and S2. No m/g/r. LUNGS: CTA; No wheezes or rhonchi. Normal resp rate and effort. SKIN: W&D; intact w/o diaphoresis, erythema or rashes. NEURO: A&O x 3; CNs intact. Nonfocal.  A/P: Intrinsic asthma- Continue Albuterol MDI prn. Continue Advair daily, Wear a mask at work when working in dusty environment. Consider Pneumonia vaccine. Pt will ask Dr. Lin GivensSavage about this.  Allergic rhinitis, seasonal- Trial Clarinex 5 mg 1 tablet daily.

## 2013-11-29 ENCOUNTER — Encounter: Payer: No Typology Code available for payment source | Admitting: Family Medicine

## 2015-03-06 ENCOUNTER — Encounter (HOSPITAL_COMMUNITY): Payer: Self-pay | Admitting: Emergency Medicine

## 2015-03-06 ENCOUNTER — Emergency Department (INDEPENDENT_AMBULATORY_CARE_PROVIDER_SITE_OTHER)
Admission: EM | Admit: 2015-03-06 | Discharge: 2015-03-06 | Disposition: A | Discharge status: Home / Self Care | Payer: BLUE CROSS/BLUE SHIELD | Source: Home / Self Care | Attending: Family Medicine | Admitting: Family Medicine

## 2015-03-06 DIAGNOSIS — J069 Acute upper respiratory infection, unspecified: Secondary | ICD-10-CM

## 2015-03-06 MED ORDER — FLUTICASONE-SALMETEROL 250-50 MCG/DOSE IN AEPB: 1.0000 | INHALATION_SPRAY | Freq: Two times a day (BID) | RESPIRATORY_TRACT | Status: DC

## 2015-03-06 MED ORDER — ALBUTEROL SULFATE HFA 108 (90 BASE) MCG/ACT IN AERS: 2.0000 | INHALATION_SPRAY | Freq: Four times a day (QID) | RESPIRATORY_TRACT | Status: DC | PRN

## 2015-03-06 MED ORDER — PREDNISONE 50 MG PO TABS: ORAL_TABLET | ORAL | Status: DC

## 2015-03-06 MED ORDER — IPRATROPIUM BROMIDE 0.06 % NA SOLN: 2.0000 | Freq: Four times a day (QID) | NASAL | Status: DC

## 2015-03-06 NOTE — ED Provider Notes (Signed)
CSN: [id number]     Arrival date & time 03/06/15  1304 History   First MD Initiated Contact with Patient 03/06/15 1337     Chief Complaint  Patient presents with  . URI   (Consider location/radiation/quality/duration/timing/severity/associated sxs/prior Treatment) Patient is a 29 y.o. male presenting with URI. The history is provided by the patient.  URI Presenting symptoms: congestion, cough and rhinorrhea   Presenting symptoms: no fever   Severity:  Mild Onset quality:  Gradual Duration:  4 days Progression:  Unchanged Chronicity:  New Ineffective treatments:  OTC medications Associated symptoms: no wheezing   Risk factors comment:  Asthma   Past Medical History  Diagnosis Date  . Allergic rhinitis   . Asthma   . Allergy    Past Surgical History  Procedure Laterality Date  . Fracture surgery     Family History  Problem Relation Age of Onset  . Cancer Mother   . Asthma Brother 24   Social History  Substance Use Topics  . Smoking status: Former Smoker -- 0.30 packs/day    Quit date: 06/30/2009  . Smokeless tobacco: None  . Alcohol Use: No    Review of Systems  Constitutional: Negative for fever and chills.  HENT: Positive for congestion, postnasal drip and rhinorrhea.   Respiratory: Positive for cough. Negative for wheezing.   Cardiovascular: Negative.   Gastrointestinal: Negative.   All other systems reviewed and are negative.   Allergies  Review of patient's allergies indicates no known allergies.  Home Medications   Prior to Admission medications   Medication Sig Start Date End Date Taking? Authorizing Provider  albuterol (PROVENTIL HFA;VENTOLIN HFA) 108 (90 Base) MCG/ACT inhaler Inhale 2 puffs into the lungs every 6 (six) hours as needed for wheezing or shortness of breath. 03/06/15   Linna Hoff, MD  desloratadine (CLARINEX) 5 MG tablet Take 1 tablet (5 mg total) by mouth daily. 06/27/13   Maurice March, MD  Fluticasone-Salmeterol (ADVAIR  DISKUS) 250-50 MCG/DOSE AEPB Inhale 1 puff into the lungs 2 (two) times daily. 03/06/15   Linna Hoff, MD  ipratropium (ATROVENT) 0.02 % nebulizer solution Inhale contents of 1 vial in in nebulizer 4 times daily. 06/27/13   Maurice March, MD  ipratropium (ATROVENT) 0.06 % nasal spray Place 2 sprays into both nostrils 4 (four) times daily. 03/06/15   Linna Hoff, MD  predniSONE (DELTASONE) 50 MG tablet 1 tab daily for 2 days then 1/2 tab daily for 2 days. 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   Physical Exam  Constitutional: He is oriented to person, place, and time. He appears well-developed and well-nourished. No distress.  Eyes: Pupils are equal, round, and reactive to light.  Neck: Normal range of motion. Neck supple.  Cardiovascular: Normal rate, normal heart sounds and intact distal pulses.   Pulmonary/Chest: Effort normal and breath sounds normal.  Lymphadenopathy:    He has no cervical adenopathy.  Neurological: He is alert and oriented to person, place, and time.  Skin: Skin is warm and dry.  Nursing note and vitals reviewed.   ED Course  Procedures (including critical care time)  Labs Review Labs Reviewed -  No data to display  Imaging Review No results found.   Visual Acuity Review  Right Eye Distance:   Left Eye Distance:   Bilateral Distance:    Right Eye Near:   Left Eye Near:    Bilateral Near:         MDM   1. URI (upper respiratory infection)       Linna HoffJames D Ripley Bogosian, MD 03/06/15 1359

## 2015-03-06 NOTE — ED Notes (Signed)
Cold, cough, sinus congestion

## 2015-03-06 NOTE — Discharge Instructions (Signed)
Drink plenty of fluids as discussed, use medicine as prescribed, and mucinex or delsym for cough. Return or see your doctor if further problems °

## 2016-02-14 ENCOUNTER — Ambulatory Visit (HOSPITAL_COMMUNITY)
Admission: RE | Admit: 2016-02-14 | Discharge: 2016-02-14 | Disposition: A | Discharge status: Home / Self Care | Payer: BLUE CROSS/BLUE SHIELD | Source: Ambulatory Visit | Attending: Physician Assistant | Admitting: Physician Assistant

## 2016-02-14 ENCOUNTER — Ambulatory Visit (INDEPENDENT_AMBULATORY_CARE_PROVIDER_SITE_OTHER): Payer: BLUE CROSS/BLUE SHIELD | Admitting: Physician Assistant

## 2016-02-14 VITALS — BP 132/80 | HR 69 | Temp 97.9°F | Resp 16 | Ht 72.0 in | Wt 205.6 lb

## 2016-02-14 DIAGNOSIS — R1031 Right lower quadrant pain: Secondary | ICD-10-CM | POA: Insufficient documentation

## 2016-02-14 DIAGNOSIS — Z23 Encounter for immunization: Secondary | ICD-10-CM | POA: Diagnosis not present

## 2016-02-14 NOTE — Patient Instructions (Signed)
     IF you received an x-ray today, you will receive an invoice from [facility]. Please contact [facility] at [phone] with questions or concerns regarding your invoice.   IF you received labwork today, you will receive an invoice from [facility]. Please contact [facility] at [phone] with questions or concerns regarding your invoice.   Our billing staff will not be able to assist you with questions regarding bills from these companies.  You will be contacted with the lab results as soon as they are available. The fastest way to get your results is to activate your My Chart account. Instructions are located on the last page of this paperwork. If you have not heard from us regarding the results in 2 weeks, please contact this office.      

## 2016-02-14 NOTE — Progress Notes (Signed)
[facility] [address], [place] [id number] [phone]  Date:  02/14/2016   Name:  Logan RoyaltyJoshua H Zill   DOB:  1985/10/09   MRN:  [id number]  PCP:  Tally DueGUEST, CHRIS Ardis, MD    History of Present Illness:  Logan RoyaltyJoshua H Yetter is a 30 y.o. male patient who [facility].    Work yesterday with heavy lifting.  He was developing groin pain at the right side with a knot.  The more work, the more aggravated.  Coughing would worsen the pain.  Pain radiatess around to the side of flank.   No pain radiating to the testicles.  No blood in urine, dysuria.  No penile discharge.    Patient Active Problem List   Diagnosis Date Noted  . Allergic rhinitis   . Intrinsic asthma     Past Medical History:  Diagnosis Date  . Allergic rhinitis   . Allergy   . Asthma     Past Surgical History:  Procedure Laterality Date  . FRACTURE SURGERY      Social History  Substance Use Topics  . Smoking status: Former Smoker    Packs/day: 0.30    Quit date: 06/30/2009  . Smokeless tobacco: Never Used  . Alcohol use No    Family History  Problem Relation Age of Onset  . Cancer Mother   . Asthma Brother 25    No Known Allergies  Medication list has been reviewed and updated.  Current Outpatient Prescriptions on File Prior to Visit  Medication Sig Dispense Refill  . albuterol (PROVENTIL HFA;VENTOLIN HFA) 108 (90 Base) MCG/ACT inhaler Inhale 2 puffs into the lungs every 6 (six) hours as needed for wheezing or shortness of breath. 1 Inhaler 1  . Fluticasone-Salmeterol (ADVAIR DISKUS) 250-50 MCG/DOSE AEPB Inhale 1 puff into the lungs 2 (two) times daily. 60 each 1  . ipratropium (ATROVENT) 0.02 % nebulizer solution Inhale contents of 1 vial in in nebulizer 4 times daily. 75 mL 3  . ipratropium (ATROVENT) 0.06 % nasal spray Place 2 sprays into both nostrils 4 (four) times daily. 15 mL 1   No current facility-administered medications on file prior to visit.      ROS ROS otherwise unremarkable unless listed above.   Physical Examination: BP 132/80 (BP Location: Right Arm, Patient Position: Sitting, Cuff Size: Large)   Pulse 69   Temp 97.9 F (36.6 C) (Oral)   Resp 16   Ht 6' (1.829 m)   Wt 205 lb 9.6 oz (93.3 kg)   SpO2 100%   BMI 27.88 kg/m  Ideal Body Weight: Weight in (lb) to have BMI = 25: 183.9  Physical Exam  Constitutional: He is oriented to person, place, and time. He appears well-developed and well-nourished. No distress.  HENT:  Head: Normocephalic and atraumatic.  Eyes: Conjunctivae and EOM are normal. Pupils are equal, round, and reactive to light.  Cardiovascular: Normal rate.   Pulmonary/Chest: Effort normal. No respiratory distress.  Abdominal: Soft. Normal appearance and bowel sounds are normal. There is no hepatosplenomegaly. There is tenderness. There is no CVA tenderness, no tenderness at McBurney's point and negative Murphy's sign. No hernia. Hernia confirmed negative in the right inguinal area.  Right groin with localized swelling upon palpation that is tender.  No erythema or warmth.  No increased prominence with tensing the musculature.  Neurological: He is alert and oriented to person, place, and time.  Skin: Skin is warm and  dry. He is not diaphoretic.  Psychiatric: He has a normal mood and affect. His behavior is normal.     Assessment and Plan: Logan Ellis is a 30 y.o. male who is here today for bump at groin. Lymphadenopathy vs hernia. Ultrasound obtained. If patient has hernia will refer to general surgery.  Groin pain, right - Plan: US Abdomen Limited  Flu vaccine need - Plan: Flu Vaccine QUAD 36+ mos PF IM (Fluarix & Fluzone Quad PF)  Trena PlattStephanie English, PA-C [facility] 02/14/2016 10:56 AM

## 2016-02-18 ENCOUNTER — Telehealth: Payer: Self-pay

## 2016-02-18 NOTE — Telephone Encounter (Signed)
PATIENT STATES HE WAS IN THE OFFICE ON Friday AND HE SAW STEPHANIE ENGLISH FOR GROIN PAIN. SHE SENT HIM DIRECTLY TO [facility] TO HAVE AN MRI DONE. HE HAS BEEN OUT OF WORK AND HE SHOULD RETURN BACK TO WORK ON Wednesday 02/19/2016. THEY TOLD HIM AT [facility] THAT STEPHANIE SHOULD GET THE RESULTS ON Monday BUT HE HAS NOT HEARD ANYTHING BACK FROM US. HE SAID HE DOES NOT KNOW WHAT TO DO BECAUSE HE DOES NOT THINK HE IS READY TO RETURN TO WORK AND HE MAY NEED TO USE FMLA? HE REALLY NEEDS SOMEONE TO CALL HIM AS SOON AS POSSIBLE ESPECIALLY SINCE STEPHANIE WILL NOT BE BACK IN THE OFFICE UNTIL Friday. HE NEEDS TO KNOW WHAT TO TELL HIS BOSS. BEST PHONE [phone] (CELL)  PHARMACY CHOICE IS [facility] ON [address].  MBC

## 2016-02-19 ENCOUNTER — Encounter: Payer: Self-pay | Admitting: Emergency Medicine

## 2016-02-19 NOTE — Telephone Encounter (Signed)
Pt came to the clinic today for US results. Dr. Clelia CroftShaw reviewed and cleared patient to return to work

## 2016-02-20 NOTE — Telephone Encounter (Signed)
I have attempted to call him from home but there is no answer.  Please ask how he is doing.  If he still has pain that is not improving, I would like him to return on Friday.

## 2016-02-20 NOTE — Telephone Encounter (Signed)
Thanks so much. 

## 2016-02-20 NOTE — Telephone Encounter (Signed)
He is doing well and was eager to return to work.

## 2018-07-18 IMAGING — US US ABDOMEN LIMITED
1 series · 14 of 25 positions shown · non-contrast
Comparison: None.

CLINICAL DATA: Right groin pain, possible hernia

EXAM:
LIMITED ABDOMINAL ULTRASOUND

[Series 1: us abdomen limited · 0.14mm/px · 28 acquisitions, 14 frames shown]
[im 1/28]
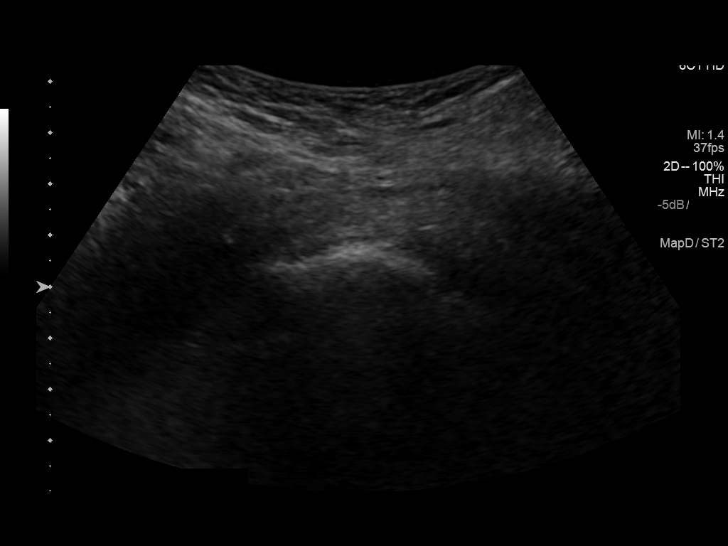
[im 3/28]
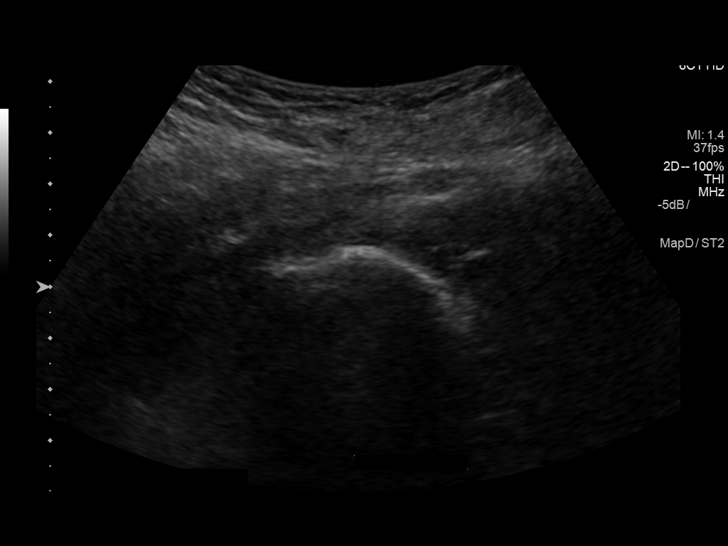
[im 5/28]
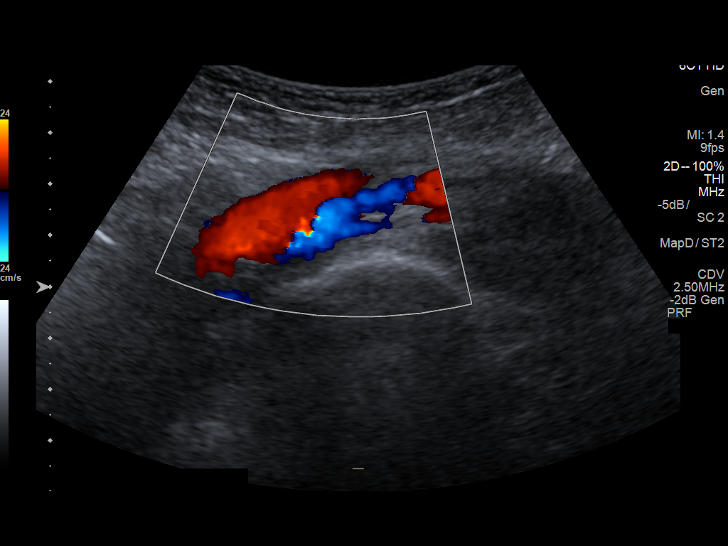
[im 7/28]
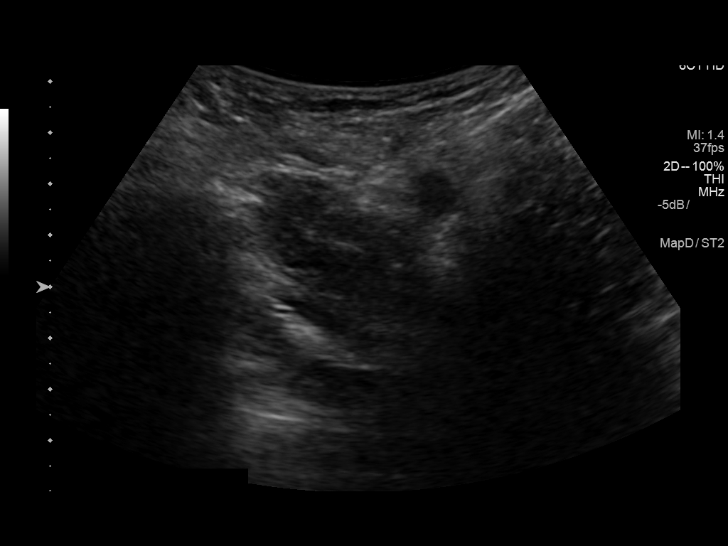
[im 10/28]
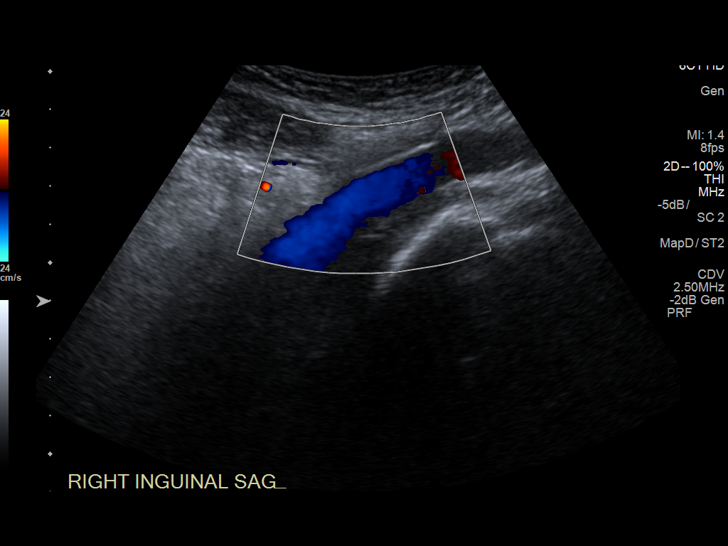
[im 11/28]
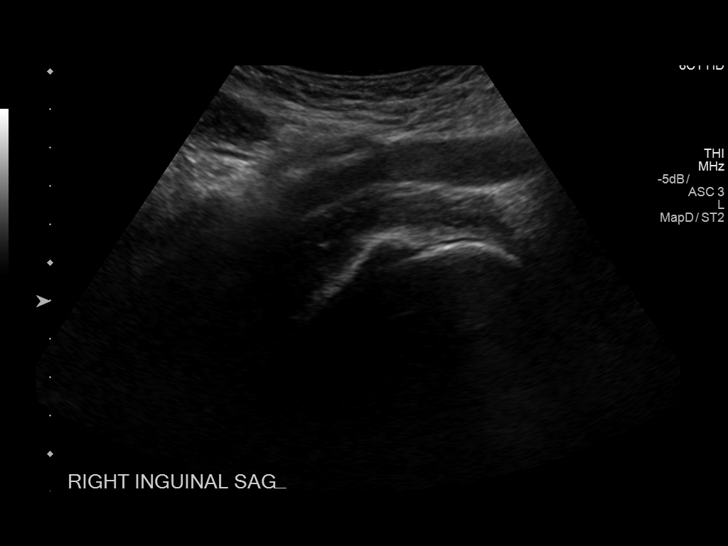
[im 13/28]
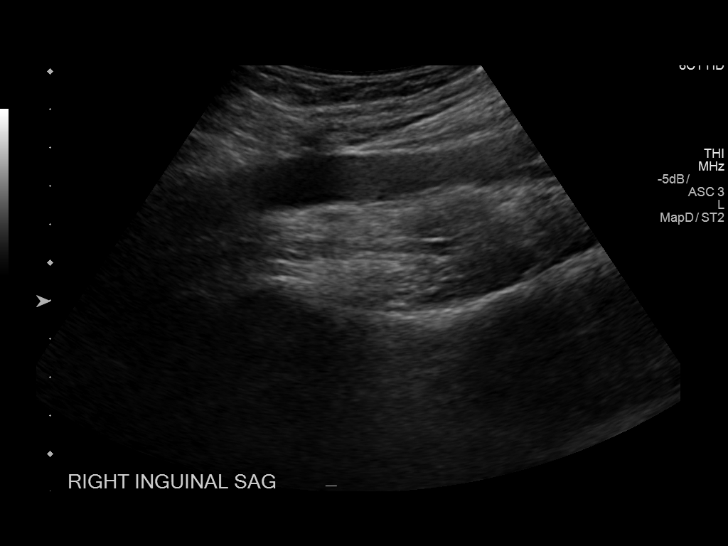
[im 15/28]
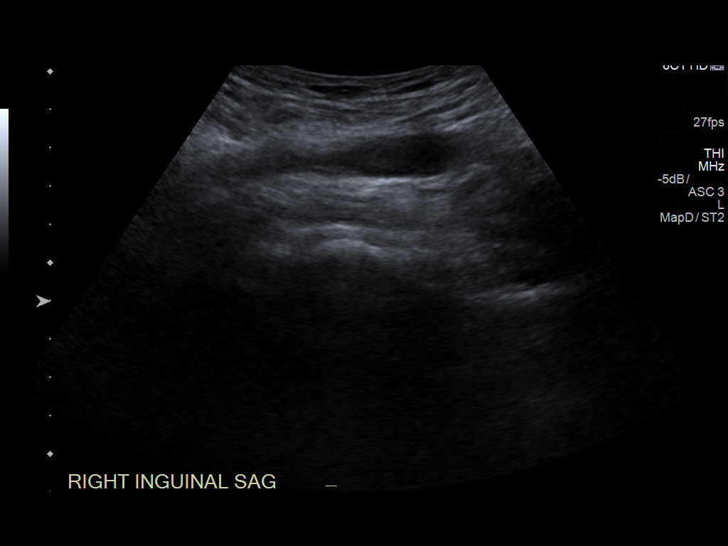
[im 17/28]
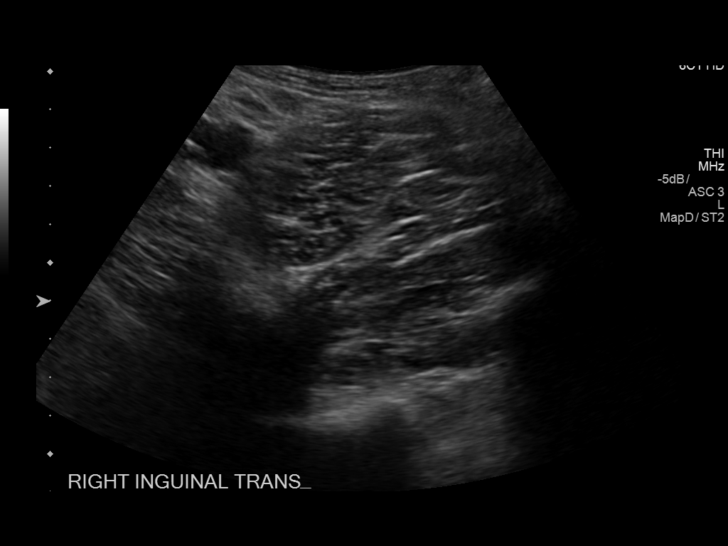
[im 19/28]
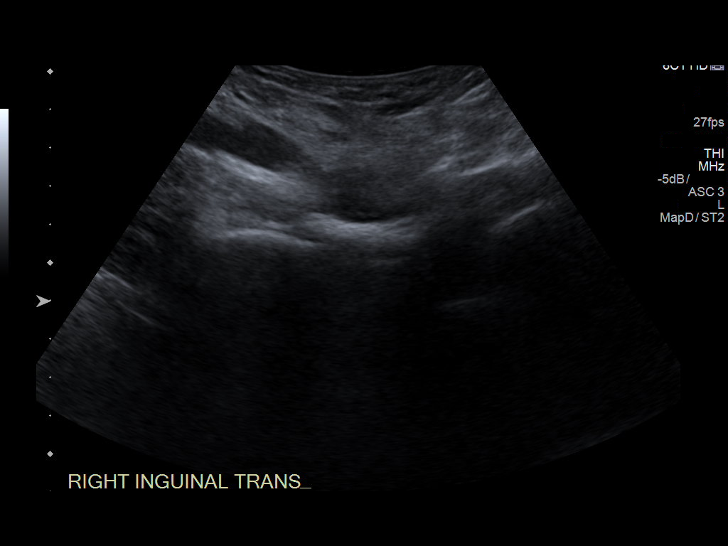
[im 21/28]
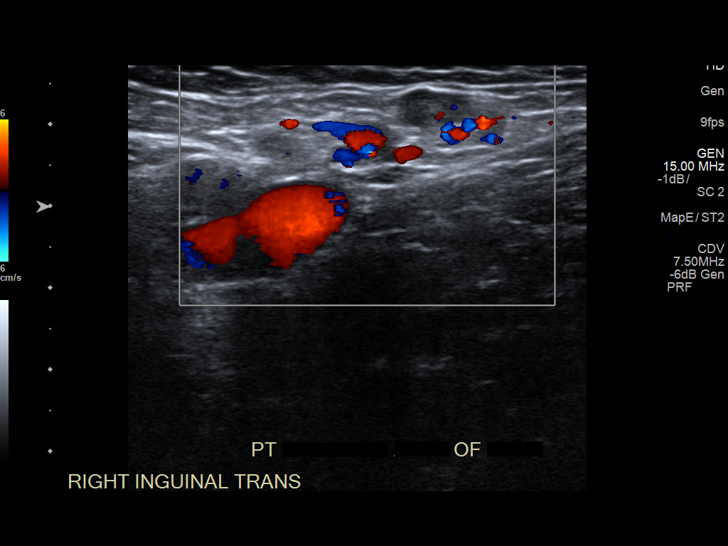
[im 23/28]
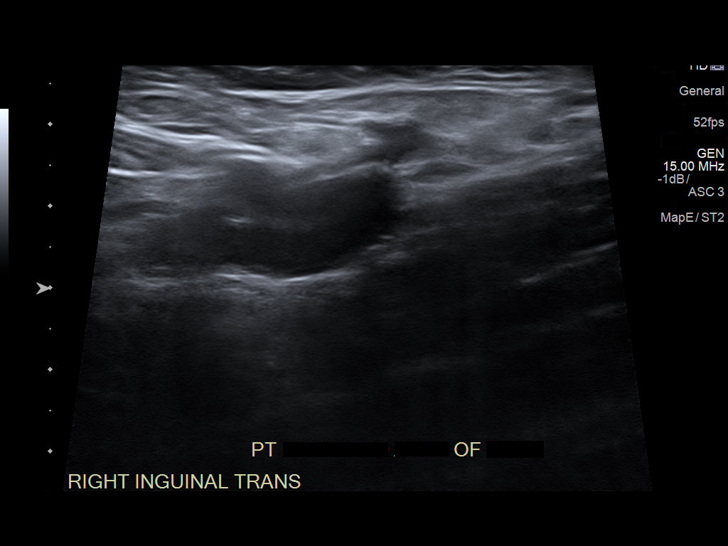
[im 25/28]
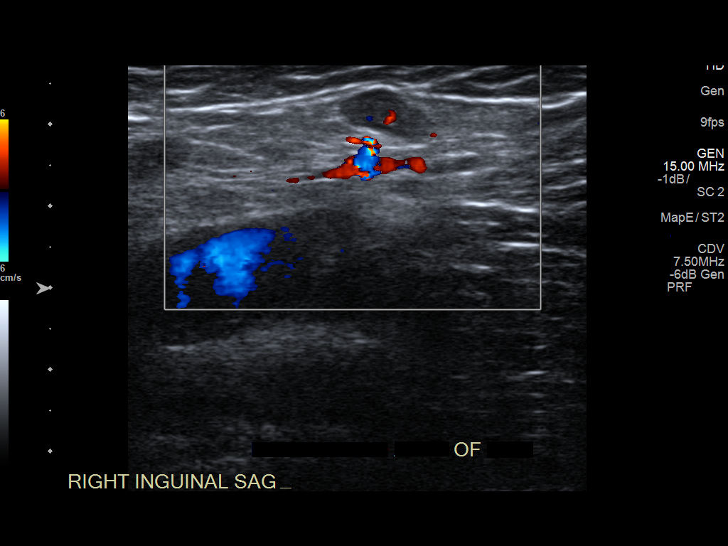
[im 28/28]
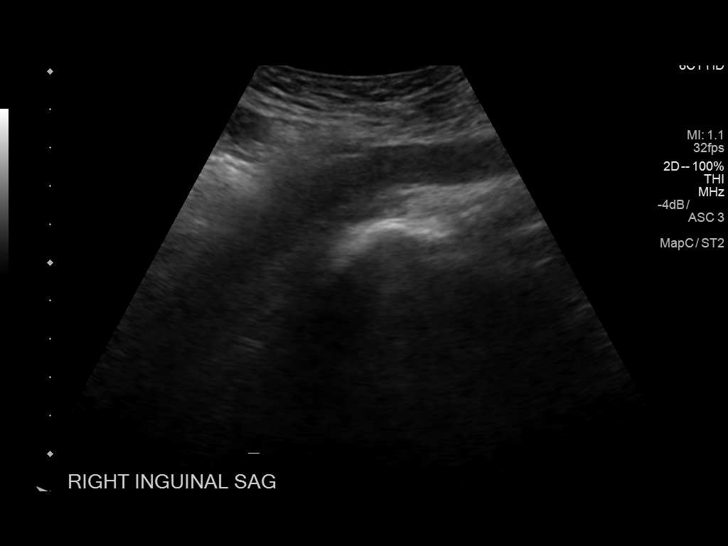

[14 of 25 positions shown; findings below may reference images not displayed]

FINDINGS: Sonographic evaluation in the area of clinical concern shows no
evidence of inguinal hernia. A mildly prominent lymph node is noted
in the area of clinical concern. This measures 7 mm in short axis
and is likely reactive in nature.
IMPRESSION: No hernia is identified. A focal lymph node measuring 7 mm in short
axis is noted which is likely reactive in nature.

## 2018-12-27 ENCOUNTER — Other Ambulatory Visit: Payer: Self-pay

## 2018-12-27 DIAGNOSIS — Z20822 Contact with and (suspected) exposure to covid-19: Secondary | ICD-10-CM

## 2018-12-28 LAB — NOVEL CORONAVIRUS, NAA: SARS-CoV-2, NAA: NOT DETECTED

## 2019-06-10 ENCOUNTER — Ambulatory Visit: Payer: BC Managed Care – PPO | Attending: Internal Medicine

## 2019-06-10 DIAGNOSIS — Z23 Encounter for immunization: Secondary | ICD-10-CM

## 2019-06-10 NOTE — Progress Notes (Signed)
   Covid-19 Vaccination Clinic  Name:  Logan Ellis    MRN: [id number] DOB: 07-15-1985  06/10/2019  Mr. [name] was observed post Covid-19 immunization for 15 minutes without incident. He was provided with Vaccine Information Sheet and instruction to access the V-Safe system.   Mr. [name] was instructed to call 911 with any severe reactions post vaccine: Marland Kitchen Difficulty breathing  . Swelling of face and throat  . A fast heartbeat  . A bad rash all over body  . Dizziness and weakness   Immunizations Administered    Name Date Dose VIS Date Route   Moderna COVID-19 Vaccine 06/10/2019 11:03 AM 0.5 mL 02/07/2019 Intramuscular   Manufacturer: Moderna   Lot: [id number]   NDC: 98721-587-27

## 2020-10-07 ENCOUNTER — Ambulatory Visit (HOSPITAL_COMMUNITY)
Admission: EM | Admit: 2020-10-07 | Discharge: 2020-10-07 | Disposition: A | Discharge status: Home / Self Care | Payer: BC Managed Care – PPO | Attending: Internal Medicine | Admitting: Internal Medicine

## 2020-10-07 ENCOUNTER — Encounter (HOSPITAL_COMMUNITY): Payer: Self-pay

## 2020-10-07 ENCOUNTER — Other Ambulatory Visit: Payer: Self-pay

## 2020-10-07 DIAGNOSIS — L03314 Cellulitis of groin: Secondary | ICD-10-CM

## 2020-10-07 MED ORDER — CEPHALEXIN 500 MG PO CAPS: 500.0000 mg | ORAL_CAPSULE | Freq: Three times a day (TID) | ORAL | 0 refills | Status: DC

## 2020-10-07 MED ORDER — IBUPROFEN 600 MG PO TABS: 600.0000 mg | ORAL_TABLET | Freq: Four times a day (QID) | ORAL | 0 refills | Status: DC | PRN

## 2020-10-07 NOTE — Discharge Instructions (Addendum)
Please apply warm compress to the area 2-3 times a day Take medications as prescribed If you have worsening swelling or pus collection please come to the urgent care to have incision and drainage If you have worsening symptoms please return to the urgent care to be reevaluated.

## 2020-10-07 NOTE — ED Triage Notes (Signed)
Pt states he might have possible hernia to right groin, that is causing pain.  Noticed about a week ago, thought it was just a boil.

## 2020-10-08 NOTE — ED Provider Notes (Signed)
[facility]    CSN: [id number] Arrival date & time: 10/07/20  1234      History   Chief Complaint Chief Complaint  Patient presents with   Inguinal Hernia    HPI Logan Ellis is a 35 y.o. male comes to the urgent care with complaint of painful right groin swelling.  Patient noticed the swelling and pain about a week ago.  At that time he thought it was "boil".  The swelling and the pain has worsened over the past few days.  He tried warm compress with no improvement in his symptoms.  No drainage noted.  No fever or chills.  He was concerned that he may have an inguinal hernia.Marland Kitchen   HPI  Past Medical History:  Diagnosis Date   Allergic rhinitis    Allergy    Asthma     Patient Active Problem List   Diagnosis Date Noted   Allergic rhinitis    Intrinsic asthma     Past Surgical History:  Procedure Laterality Date   FRACTURE SURGERY         Home Medications    Prior to Admission medications   Medication Sig Start Date End Date Taking? Authorizing Provider  cephALEXin (KEFLEX) 500 MG capsule Take 1 capsule (500 mg total) by mouth 3 (three) times daily. 10/07/20  Yes Deepak Bless, Britta Mccreedy, MD  ibuprofen (ADVIL) 600 MG tablet Take 1 tablet (600 mg total) by mouth every 6 (six) hours as needed. 10/07/20  Yes Deontra Pereyra, Britta Mccreedy, MD    Family History Family History  Problem Relation Age of Onset   Cancer Mother    Asthma Brother 62    Social History Social History   Tobacco Use   Smoking status: Former    Packs/day: 0.30    Types: Cigarettes    Quit date: 06/30/2009    Years since quitting: 11.2   Smokeless tobacco: Never  Vaping Use   Vaping Use: Never used  Substance Use Topics   Alcohol use: Yes    Comment: occasionally   Drug use: No     Allergies   Patient has no known allergies.   Review of Systems Review of Systems  Skin:  Positive for color change. Negative for rash and wound.  Neurological: Negative.     Physical Exam Triage Vital  Signs ED Triage Vitals  Enc Vitals Group     BP 10/07/20 1409 118/71     Pulse Rate 10/07/20 1409 80     Resp 10/07/20 1409 18     Temp 10/07/20 1409 98.3 F (36.8 C)     Temp Source 10/07/20 1409 Oral     SpO2 10/07/20 1409 100 %     Weight --      Height --      Head Circumference --      Peak Flow --      Pain Score 10/07/20 1406 7     Pain Loc --      Pain Edu? --      Excl. in [place]? --    No data found.  Updated Vital Signs BP 118/71 (BP Location: Right Arm)   Pulse 80   Temp 98.3 F (36.8 C) (Oral)   Resp 18   SpO2 100%   Visual Acuity Right Eye Distance:   Left Eye Distance:   Bilateral Distance:    Right Eye Near:   Left Eye Near:    Bilateral Near:     Physical Exam  Vitals and nursing note reviewed.  Constitutional:      General: He is in acute distress.     Appearance: He is not ill-appearing.  Cardiovascular:     Rate and Rhythm: Normal rate and regular rhythm.  Abdominal:     General: Bowel sounds are normal.  Genitourinary:    Comments: Inguinal orifices are clear. Skin:    Comments: Indurated area in the right groin area.  Indurated area is mildly erythematous.  It measures about 4 x 6 cm.  No fluctuance.  Neurological:     Mental Status: He is alert.     UC Treatments / Results  Labs (all labs ordered are listed, but only abnormal results are displayed) Labs Reviewed - No data to display  EKG   Radiology No results found.  Procedures Procedures (including critical care time)  Medications Ordered in UC Medications - No data to display  Initial Impression / Assessment and Plan / UC Course  I have reviewed the triage vital signs and the nursing notes.  Pertinent labs & imaging results that were available during my care of the patient were reviewed by me and considered in my medical decision making (see chart for details).     1.  Cellulitis of the right groin: Keflex 500 mg 3 times daily for 7 days Ibuprofen 600 mg every 6  hours as needed for pain Warm compresses advised If an abscess develops patient is advised to come to the urgent care for incision and drainage. Final Clinical Impressions(s) / UC Diagnoses   Final diagnoses:  Cellulitis of groin     Discharge Instructions      Please apply warm compress to the area 2-3 times a day Take medications as prescribed If you have worsening swelling or pus collection please come to the urgent care to have incision and drainage If you have worsening symptoms please return to the urgent care to be reevaluated.   ED Prescriptions     Medication Sig Dispense Auth. Provider   cephALEXin (KEFLEX) 500 MG capsule Take 1 capsule (500 mg total) by mouth 3 (three) times daily. 20 capsule Adilyn Humes, Britta Mccreedy, MD   ibuprofen (ADVIL) 600 MG tablet Take 1 tablet (600 mg total) by mouth every 6 (six) hours as needed. 30 tablet Mariachristina Holle, Britta Mccreedy, MD      PDMP not reviewed this encounter.   Merrilee Jansky, MD 10/08/20 [phone]

## 2021-02-16 ENCOUNTER — Other Ambulatory Visit: Payer: Self-pay

## 2021-02-16 ENCOUNTER — Ambulatory Visit (HOSPITAL_COMMUNITY)
Admission: EM | Admit: 2021-02-16 | Discharge: 2021-02-16 | Disposition: A | Discharge status: Home / Self Care | Payer: BC Managed Care – PPO | Attending: Urgent Care | Admitting: Urgent Care

## 2021-02-16 ENCOUNTER — Encounter (HOSPITAL_COMMUNITY): Payer: Self-pay

## 2021-02-16 DIAGNOSIS — T7840XA Allergy, unspecified, initial encounter: Secondary | ICD-10-CM | POA: Diagnosis not present

## 2021-02-16 MED ORDER — PREDNISONE 10 MG PO TABS: ORAL_TABLET | ORAL | 0 refills | Status: AC

## 2021-02-16 MED ORDER — METHYLPREDNISOLONE SODIUM SUCC 125 MG IJ SOLR
INTRAMUSCULAR | Status: AC
  Filled 2021-02-16: qty 2

## 2021-02-16 MED ORDER — HYDROXYZINE HCL 25 MG PO TABS: 25.0000 mg | ORAL_TABLET | Freq: Four times a day (QID) | ORAL | 0 refills | Status: AC | PRN

## 2021-02-16 MED ORDER — METHYLPREDNISOLONE SODIUM SUCC 125 MG IJ SOLR
125.0000 mg | Freq: Once | INTRAMUSCULAR | Status: AC
  Administered 2021-02-16: 125 mg via INTRAMUSCULAR

## 2021-02-16 NOTE — Discharge Instructions (Addendum)
Allergic reaction of unknown etiology. Take prednisone pack as discussed, start tomorrow as you were given a Solu-Medrol injection in office. Take hydroxyzine on an as-needed basis for itching. side effect of sedation may occur Return to the clinic or head to the ER if any new or worsening symptoms develop, such as shortness of breath, wheezing, swelling of throat or tongue.

## 2021-02-16 NOTE — ED Triage Notes (Signed)
Pt presented to the office today for hives all over body x 2 days.

## 2021-02-16 NOTE — ED Provider Notes (Signed)
[facility]    CSN: [id number] Arrival date & time: 02/16/21  1015      History   Chief Complaint Chief Complaint  Patient presents with   Urticaria    HPI Logan Ellis is a 35 y.o. male.   35 year old male who reports a mild itchy rash starting on his right hand on Thursday.  He states he works at [employer] and was at work when the symptoms started.  He denies any new exposures or contact with anything.  He does wear work Writer but has worked there for many years and never had this reaction.  He states over the past 2 days it is spread from his right hand up his right arm and now extends to his entire body including his face and scalp, it does spare his groin.  He has been using over-the-counter Benadryl and topical hydrocortisone cream.  He denies any swelling of his tongue or throat, denies dysphagia, GERD, wheezing, cough.  He has no chronic  medical conditions and takes no daily medications   Urticaria   Past Medical History:  Diagnosis Date   Allergic rhinitis    Allergy    Asthma     Patient Active Problem List   Diagnosis Date Noted   Allergic rhinitis    Intrinsic asthma     Past Surgical History:  Procedure Laterality Date   FRACTURE SURGERY         Home Medications    Prior to Admission medications   Medication Sig Start Date End Date Taking? Authorizing Provider  hydrOXYzine (ATARAX) 25 MG tablet Take 1 tablet (25 mg total) by mouth every 6 (six) hours as needed for up to 5 days for itching. 02/16/21 02/21/21 Yes Xzander Gilham L, PA  predniSONE (DELTASONE) 10 MG tablet Take 6 tablets (60 mg total) by mouth daily with breakfast for 2 days, THEN 5 tablets (50 mg total) daily with breakfast for 2 days, THEN 4 tablets (40 mg total) daily with breakfast for 2 days, THEN 3 tablets (30 mg total) daily with breakfast for 2 days, THEN 2 tablets (20 mg total) daily with breakfast for 2 days, THEN 1 tablet (10 mg total) daily with breakfast for 2  days. 02/16/21 02/28/21 Yes Barri Neidlinger, Jodelle Gross, PA    Family History Family History  Problem Relation Age of Onset   Cancer Mother    Asthma Brother 85    Social History Social History   Tobacco Use   Smoking status: Former    Packs/day: 0.30    Types: Cigarettes    Quit date: 06/30/2009    Years since quitting: 11.6   Smokeless tobacco: Never  Vaping Use   Vaping Use: Never used  Substance Use Topics   Alcohol use: Yes    Comment: occasionally   Drug use: No     Allergies   Patient has no known allergies.   Review of Systems Review of Systems  Skin:  Positive for rash (urticaria, itching).  All other systems reviewed and are negative.   Physical Exam Triage Vital Signs ED Triage Vitals  Enc Vitals Group     BP 02/16/21 1123 139/75     Pulse Rate 02/16/21 1123 67     Resp 02/16/21 1123 18     Temp 02/16/21 1123 97.6 F (36.4 C)     Temp Source 02/16/21 1123 Oral     SpO2 02/16/21 1123 100 %     Weight --  Height --      Head Circumference --      Peak Flow --      Pain Score 02/16/21 1124 4     Pain Loc --      Pain Edu? --      Excl. in GC? --    No data found.  Updated Vital Signs BP 139/75 (BP Location: Left Arm)   Pulse 67   Temp 97.6 F (36.4 C) (Oral)   Resp 18   SpO2 100%   Visual Acuity Right Eye Distance:   Left Eye Distance:   Bilateral Distance:    Right Eye Near:   Left Eye Near:    Bilateral Near:     Physical Exam Vitals and nursing note reviewed.  Constitutional:      General: He is not in acute distress.    Appearance: Normal appearance. He is well-developed and normal weight.  HENT:     Head: Normocephalic and atraumatic.     Right Ear: Tympanic membrane, ear canal and external ear normal.     Left Ear: Tympanic membrane, ear canal and external ear normal.     Nose: Nose normal. No congestion or rhinorrhea.     Mouth/Throat:     Mouth: Mucous membranes are moist.     Pharynx: Oropharynx is clear. No  oropharyngeal exudate or posterior oropharyngeal erythema.  Eyes:     General: No scleral icterus.       Right eye: No discharge.        Left eye: No discharge.     Extraocular Movements: Extraocular movements intact.     Conjunctiva/sclera: Conjunctivae normal.     Pupils: Pupils are equal, round, and reactive to light.  Cardiovascular:     Rate and Rhythm: Normal rate and regular rhythm.     Heart sounds: No murmur heard. Pulmonary:     Effort: Pulmonary effort is normal. No respiratory distress.     Breath sounds: Normal breath sounds. No wheezing or rales.  Chest:     Chest wall: No tenderness.  Abdominal:     Palpations: Abdomen is soft.     Tenderness: There is no abdominal tenderness.  Musculoskeletal:        General: No swelling.     Cervical back: Normal range of motion and neck supple. No tenderness.  Lymphadenopathy:     Cervical: No cervical adenopathy.  Skin:    General: Skin is warm and dry.     Capillary Refill: Capillary refill takes less than 2 seconds.  Neurological:     Mental Status: He is alert.  Psychiatric:        Mood and Affect: Mood normal.     UC Treatments / Results  Labs (all labs ordered are listed, but only abnormal results are displayed) Labs Reviewed - No data to display  EKG   Radiology No results found.  Procedures Procedures (including critical care time)  Medications Ordered in UC Medications  methylPREDNISolone sodium succinate (SOLU-MEDROL) 125 mg/2 mL injection 125 mg (125 mg Intramuscular Given 02/16/21 1153)    Initial Impression / Assessment and Plan / UC Course  I have reviewed the triage vital signs and the nursing notes.  Pertinent labs & imaging results that were available during my care of the patient were reviewed by me and considered in my medical decision making (see chart for details).     Acute urticaria - unknown etiology.  Solu-Medrol injection x1 today.  Start p.o. prednisone outpatient  x12-day taper.   Hydroxyzine on an as-needed basis. No signs of systemic symptoms.  Final Clinical Impressions(s) / UC Diagnoses   Final diagnoses:  Allergic reaction, initial encounter     Discharge Instructions      Allergic reaction of unknown etiology. Take prednisone pack as discussed, start tomorrow as you were given a Solu-Medrol injection in office. Take hydroxyzine on an as-needed basis for itching. side effect of sedation may occur Return to the clinic or head to the ER if any new or worsening symptoms develop, such as shortness of breath, wheezing, swelling of throat or tongue.     ED Prescriptions     Medication Sig Dispense Auth. Provider   predniSONE (DELTASONE) 10 MG tablet Take 6 tablets (60 mg total) by mouth daily with breakfast for 2 days, THEN 5 tablets (50 mg total) daily with breakfast for 2 days, THEN 4 tablets (40 mg total) daily with breakfast for 2 days, THEN 3 tablets (30 mg total) daily with breakfast for 2 days, THEN 2 tablets (20 mg total) daily with breakfast for 2 days, THEN 1 tablet (10 mg total) daily with breakfast for 2 days. 42 tablet Rishard Delange L, PA   hydrOXYzine (ATARAX) 25 MG tablet Take 1 tablet (25 mg total) by mouth every 6 (six) hours as needed for up to 5 days for itching. 12 tablet Efstathios Sawin L, [place]      PDMP not reviewed this encounter.   Maretta Bees, [place] 02/16/21 1225

## 2022-04-15 ENCOUNTER — Ambulatory Visit (HOSPITAL_COMMUNITY)
Admission: EM | Admit: 2022-04-15 | Discharge: 2022-04-15 | Disposition: A | Discharge status: Home / Self Care | Payer: BC Managed Care – PPO | Attending: Emergency Medicine | Admitting: Emergency Medicine

## 2022-04-15 DIAGNOSIS — L509 Urticaria, unspecified: Secondary | ICD-10-CM

## 2022-04-15 MED ORDER — DEXAMETHASONE SODIUM PHOSPHATE 10 MG/ML IJ SOLN
INTRAMUSCULAR | Status: AC
  Filled 2022-04-15: qty 1

## 2022-04-15 MED ORDER — HYDROXYZINE HCL 25 MG PO TABS: 25.0000 mg | ORAL_TABLET | Freq: Four times a day (QID) | ORAL | 0 refills | Status: DC

## 2022-04-15 MED ORDER — PREDNISONE 10 MG (21) PO TBPK: ORAL_TABLET | Freq: Every day | ORAL | 0 refills | Status: DC

## 2022-04-15 MED ORDER — DEXAMETHASONE SODIUM PHOSPHATE 10 MG/ML IJ SOLN
10.0000 mg | Freq: Once | INTRAMUSCULAR | Status: AC
  Administered 2022-04-15: 10 mg via INTRAMUSCULAR

## 2022-04-15 NOTE — ED Provider Notes (Signed)
[facility]    CSN: [id number] Arrival date & time: 04/15/22  1026      History   Chief Complaint Chief Complaint  Patient presents with   Rash    HPI Logan Ellis is a 37 y.o. male.   Patient presents for evaluation of erythematous pruritic rash present to the bilateral upper extremities, chest and neck present for 1 day.  Initially started on the hands and spread to the trunk of the body.  Has attempted use of Benadryl which has been ineffective.  Unknown trigger, denies changes in toiletries, diet or recent travel.  Symptoms have occurred once before .  Denies respiratory symptoms.  History of allergies and asthma.    Past Medical History:  Diagnosis Date   Allergic rhinitis    Allergy    Asthma     Patient Active Problem List   Diagnosis Date Noted   Allergic rhinitis    Intrinsic asthma     Past Surgical History:  Procedure Laterality Date   FRACTURE SURGERY         Home Medications    Prior to Admission medications   Not on File    Family History Family History  Problem Relation Age of Onset   Cancer Mother    Asthma Brother 62    Social History Social History   Tobacco Use   Smoking status: Former    Packs/day: 0.30    Types: Cigarettes    Quit date: 06/30/2009    Years since quitting: 12.8   Smokeless tobacco: Never  Vaping Use   Vaping Use: Never used  Substance Use Topics   Alcohol use: Yes    Comment: occasionally   Drug use: No     Allergies   Patient has no known allergies.   Review of Systems Review of Systems  Constitutional: Negative.   HENT: Negative.    Respiratory: Negative.    Cardiovascular: Negative.   Skin:  Positive for rash. Negative for color change, pallor and wound.  Neurological: Negative.      Physical Exam Triage Vital Signs ED Triage Vitals  Enc Vitals Group     BP 04/15/22 1054 (!) 153/85     Pulse Rate 04/15/22 1054 87     Resp 04/15/22 1054 19     Temp 04/15/22 1054 98 F  (36.7 C)     Temp src --      SpO2 04/15/22 1054 98 %     Weight --      Height --      Head Circumference --      Peak Flow --      Pain Score 04/15/22 1052 0     Pain Loc --      Pain Edu? --      Excl. in [place]? --    No data found.  Updated Vital Signs BP (!) 153/85   Pulse 87   Temp 98 F (36.7 C)   Resp 19   SpO2 98%   Visual Acuity Right Eye Distance:   Left Eye Distance:   Bilateral Distance:    Right Eye Near:   Left Eye Near:    Bilateral Near:     Physical Exam Constitutional:      Appearance: Normal appearance.  Eyes:     Extraocular Movements: Extraocular movements intact.  Pulmonary:     Effort: Pulmonary effort is normal.  Skin:    Comments: Hives present to the bilateral upper extremities, the  chest wall, upper back and posterior neck  Neurological:     Mental Status: He is alert.      UC Treatments / Results  Labs (all labs ordered are listed, but only abnormal results are displayed) Labs Reviewed - No data to display  EKG   Radiology No results found.  Procedures Procedures (including critical care time)  Medications Ordered in UC Medications - No data to display  Initial Impression / Assessment and Plan / UC Course  I have reviewed the triage vital signs and the nursing notes.  Pertinent labs & imaging results that were available during my care of the patient were reviewed by me and considered in my medical decision making (see chart for details).  Hives  No respiratory involvement at this time, O2 saturation 98% on room air patient is in no signs of distress, unknown trigger as he endorses no change in day-to-day routines, Decadron injection given in office and prescribed prednisone and hydroxyzine for outpatient use, may use a topical antihistamines or calamine lotion for additional comfort, advised to follow-up if symptoms continue to persist or worsen Final Clinical Impressions(s) / UC Diagnoses   Final diagnoses:  None    Discharge Instructions   None    ED Prescriptions   None    PDMP not reviewed this encounter.   Hans Eden, NP 04/15/22 1216

## 2022-04-15 NOTE — ED Triage Notes (Signed)
Pt presents to uc with co of hives that started yesterday morning on his hands and now has progressed to arms, neck, back and abd. Pt reports rash is itchy. No new exposures. No otc creams just po benadryl he has family history of ra and lupus.

## 2022-04-15 NOTE — Discharge Instructions (Addendum)
Rash appears to be inflammatory, no signs of infection, unknown triggers you have not changed any of your toiletries or diet or had any recent travel  You have been given an injection of steroids here in the office to reduce inflammation which will help to clear your rash, should start to see improvement in about 30 minutes  Tomorrow take oral prednisone every morning as directed to continue the above process  You may use hydroxyzine every 6 hours as needed for itching, may also use a topical Benadryl cream or calamine lotion for comfort  If your symptoms continue to persist or worsen you may follow-up with urgent care for reevaluation  If you begin to have rashes more frequently you will need follow-up with the allergist for testing

## 2023-11-08 ENCOUNTER — Encounter (HOSPITAL_COMMUNITY): Payer: Self-pay | Admitting: Emergency Medicine

## 2023-11-08 ENCOUNTER — Other Ambulatory Visit: Payer: Self-pay

## 2023-11-08 ENCOUNTER — Ambulatory Visit (HOSPITAL_COMMUNITY)
Admission: EM | Admit: 2023-11-08 | Discharge: 2023-11-08 | Disposition: A | Discharge status: Home / Self Care | Attending: Internal Medicine | Admitting: Internal Medicine

## 2023-11-08 DIAGNOSIS — R6889 Other general symptoms and signs: Secondary | ICD-10-CM | POA: Diagnosis not present

## 2023-11-08 DIAGNOSIS — J029 Acute pharyngitis, unspecified: Secondary | ICD-10-CM | POA: Diagnosis not present

## 2023-11-08 DIAGNOSIS — J209 Acute bronchitis, unspecified: Secondary | ICD-10-CM | POA: Diagnosis not present

## 2023-11-08 LAB — POC COVID19/FLU A&B COMBO
Covid Antigen, POC: NEGATIVE
Influenza A Antigen, POC: NEGATIVE
Influenza B Antigen, POC: NEGATIVE

## 2023-11-08 LAB — POCT RAPID STREP A (OFFICE): Rapid Strep A Screen: NEGATIVE

## 2023-11-08 MED ORDER — ALBUTEROL SULFATE HFA 108 (90 BASE) MCG/ACT IN AERS: 2.0000 | INHALATION_SPRAY | RESPIRATORY_TRACT | 0 refills | Status: AC | PRN

## 2023-11-08 MED ORDER — ALBUTEROL SULFATE HFA 108 (90 BASE) MCG/ACT IN AERS
INHALATION_SPRAY | RESPIRATORY_TRACT | Status: AC
  Filled 2023-11-08: qty 6.7

## 2023-11-08 MED ORDER — ALBUTEROL SULFATE HFA 108 (90 BASE) MCG/ACT IN AERS
2.0000 | INHALATION_SPRAY | Freq: Once | RESPIRATORY_TRACT | Status: AC
  Administered 2023-11-08: 2 via RESPIRATORY_TRACT

## 2023-11-08 MED ORDER — CLARITHROMYCIN ER 500 MG PO TB24: 1000.0000 mg | ORAL_TABLET | Freq: Every day | ORAL | 0 refills | Status: AC

## 2023-11-08 MED ORDER — PSEUDOEPHEDRINE HCL ER 120 MG PO TB12: 120.0000 mg | ORAL_TABLET | Freq: Two times a day (BID) | ORAL | 0 refills | Status: AC

## 2023-11-08 NOTE — ED Triage Notes (Signed)
 Symptoms started late Wednesday/early Thursday.  Has done 2 covid tests, one Thursday and one Friday.    Has had hoarseness.  Today voice is back.  Runny nose, cough, thick yellow phlegm, headache, body aches , chills  Has taken dayquil and nyquil.

## 2023-11-08 NOTE — ED Provider Notes (Signed)
 [facility]    CSN: [id number] Arrival date & time: 11/08/23  1314      History   Chief Complaint Chief Complaint  Patient presents with   Cough    HPI Logan Ellis is a 38 y.o. male presents due to having URI symptoms with body aches x 5 days and ST. Had 2 negative covid tests 3 and 4  days ago. Has productive cough with yellow mucous and has been wheezing. Has not noticed having a fever when he checked it once 4 days ago.     Past Medical History:  Diagnosis Date   Allergic rhinitis    Allergy    Asthma     Patient Active Problem List   Diagnosis Date Noted   Allergic rhinitis    Intrinsic asthma     Past Surgical History:  Procedure Laterality Date   FRACTURE SURGERY         Home Medications    Prior to Admission medications   Medication Sig Start Date End Date Taking? Authorizing Provider  albuterol  (VENTOLIN  HFA) 108 (90 Base) MCG/ACT inhaler Inhale 2 puffs into the lungs every 4 (four) hours as needed for wheezing or shortness of breath. 11/08/23  Yes Rodriguez-Southworth, Yashar Inclan, PA-C  clarithromycin  (BIAXIN  XL) 500 MG 24 hr tablet Take 2 tablets (1,000 mg total) by mouth daily. 11/08/23  Yes Rodriguez-Southworth, Kyra, PA-C  pseudoephedrine  (SUDAFED SINUS CONGESTION 12HR) 120 MG 12 hr tablet Take 1 tablet (120 mg total) by mouth 2 (two) times daily. 11/08/23  Yes Rodriguez-Southworth, Kyra, PA-C    Family History Family History  Problem Relation Age of Onset   Cancer Mother    Asthma Brother 24    Social History Social History   Tobacco Use   Smoking status: Former    Current packs/day: 0.00    Types: Cigarettes    Quit date: 06/30/2009    Years since quitting: 14.3   Smokeless tobacco: Never  Vaping Use   Vaping status: Former  Substance Use Topics   Alcohol use: Yes    Comment: occasionally   Drug use: No     Allergies   Patient has no known allergies.   Review of Systems Review of Systems  As noted in HPI    Physical Exam Triage Vital Signs ED Triage Vitals  Encounter Vitals Group     BP 11/08/23 1411 137/87     Girls Systolic BP Percentile --      Girls Diastolic BP Percentile --      Boys Systolic BP Percentile --      Boys Diastolic BP Percentile --      Pulse Rate 11/08/23 1411 93     Resp 11/08/23 1411 18     Temp 11/08/23 1411 97.9 F (36.6 C)     Temp Source 11/08/23 1411 Oral     SpO2 11/08/23 1411 98 %     Weight --      Height --      Head Circumference --      Peak Flow --      Pain Score 11/08/23 1409 5     Pain Loc --      Pain Education --      Exclude from Growth Chart --    No data found.  Updated Vital Signs BP 137/87 (BP Location: Left Arm)   Pulse 93   Temp 97.9 F (36.6 C) (Oral)   Resp 18   SpO2 98%   Visual  Acuity Right Eye Distance:   Left Eye Distance:   Bilateral Distance:    Right Eye Near:   Left Eye Near:    Bilateral Near:     Physical Exam Physical Exam Constitutional:      General: He is not in acute distress.    Appearance: He is not toxic-appearing.  HENT:     Head: Normocephalic.     Right Ear: Tympanic membrane, ear canal and external ear normal.     Left Ear: Ear canal and external ear normal.     Nose: Nose normal.     Mouth/Throat:     Mouth: Mucous membranes are moist.     Pharynx: Oropharynx is clear.  Eyes:     General: No scleral icterus.    Conjunctiva/sclera: Conjunctivae normal.  Cardiovascular:     Rate and Rhythm: Normal rate and regular rhythm.     Heart sounds: No murmur heard.   Pulmonary:     Effort: Pulmonary effort is normal. No respiratory distress.     Breath sounds: Wheezing present.     Comments: Has auditory wheezing Musculoskeletal:        General: Normal range of motion.     Cervical back: Neck supple.  Lymphadenopathy:     Cervical: No cervical adenopathy.  Skin:    General: Skin is warm and dry.     Findings: No rash.  Neurological:     Mental Status: He is alert and oriented to  person, place, and time.     Gait: Gait normal.  Psychiatric:        Mood and Affect: Mood normal.        Behavior: Behavior normal.        Thought Content: Thought content normal.        Judgment: Judgment normal.    UC Treatments / Results  Labs (all labs ordered are listed, but only abnormal results are displayed) Labs Reviewed  POC COVID19/FLU A&B COMBO  POCT RAPID STREP A (OFFICE)    EKG   Radiology No results found.  Procedures Procedures (including critical care time)  Medications Ordered in UC Medications  albuterol  (VENTOLIN  HFA) 108 (90 Base) MCG/ACT inhaler 2 puff (2 puffs Inhalation Given 11/08/23 1502)    Initial Impression / Assessment and Plan / UC Course  I have reviewed the triage vital signs and the nursing notes.  Pertinent labs  results that were available during my care of the patient were reviewed by me and considered in my medical decision making (see chart for details).  Acute bronchitis  He was placed on Biaxen, Sudafed and Albuterol  inhaler as noted. See instructions.    Final Clinical Impressions(s) / UC Diagnoses   Final diagnoses:  Flu-like symptoms  Acute pharyngitis, unspecified etiology  Acute bronchitis, unspecified organism     Discharge Instructions      Your flu, strep and Covid tests are negative. You should start feeling better in 48-72 hours, but if you get worse with fevers of 100.4 or more, shortness of breath, you need to go to ER.      ED Prescriptions     Medication Sig Dispense Auth. Provider   albuterol  (VENTOLIN  HFA) 108 (90 Base) MCG/ACT inhaler Inhale 2 puffs into the lungs every 4 (four) hours as needed for wheezing or shortness of breath. 8.5 g Rodriguez-Southworth, Kyra, PA-C   pseudoephedrine  (SUDAFED SINUS CONGESTION 12HR) 120 MG 12 hr tablet Take 1 tablet (120 mg total) by mouth 2 (two) times  daily. 20 tablet Rodriguez-Southworth, Kyra, PA-C   clarithromycin  (BIAXIN  XL) 500 MG 24 hr tablet Take 2  tablets (1,000 mg total) by mouth daily. 14 tablet Rodriguez-Southworth, Kyra, PA-C      PDMP not reviewed this encounter.   Lindi Kyra, PA-C 11/08/23 1527

## 2023-11-08 NOTE — Discharge Instructions (Signed)
 Your flu, strep and Covid tests are negative. You should start feeling better in 48-72 hours, but if you get worse with fevers of 100.4 or more, shortness of breath, you need to go to ER.
